# Patient Record
Sex: Female | Born: 1990 | Race: Black or African American | Hispanic: No | Marital: Single | Smoking: Never smoker
Health system: Southern US, Academic
[De-identification: ages and names within clinical notes are randomized; demographics above are authoritative.]

## PROBLEM LIST (undated history)

## (undated) ENCOUNTER — Inpatient Hospital Stay (HOSPITAL_COMMUNITY): Payer: Self-pay

## (undated) DIAGNOSIS — F32A Depression, unspecified: Secondary | ICD-10-CM

## (undated) DIAGNOSIS — D539 Nutritional anemia, unspecified: Secondary | ICD-10-CM

## (undated) DIAGNOSIS — N760 Acute vaginitis: Secondary | ICD-10-CM

## (undated) DIAGNOSIS — R569 Unspecified convulsions: Secondary | ICD-10-CM

## (undated) DIAGNOSIS — R519 Headache, unspecified: Secondary | ICD-10-CM

## (undated) DIAGNOSIS — Z8619 Personal history of other infectious and parasitic diseases: Secondary | ICD-10-CM

## (undated) HISTORY — DX: Personal history of other infectious and parasitic diseases: Z86.19

## (undated) HISTORY — DX: Unspecified convulsions (CMS HCC): R56.9

## (undated) HISTORY — PX: HX BLOOD TRANSFUSION: 2100001151

## (undated) HISTORY — DX: Depression, unspecified: F32.A

## (undated) HISTORY — DX: Acute vaginitis: N76.0

## (undated) HISTORY — PX: HX GASTRIC SLEEVE: 2100003104

## (undated) HISTORY — DX: Headache, unspecified: R51.9

## (undated) HISTORY — DX: Nutritional anemia, unspecified: D53.9

## (undated) NOTE — Progress Notes (Signed)
 Formatting of this note might be different from the original.  O    Flu vaccine  Electronically signed by Arland Connors, LPN at 89/75/7974 10:29 AM EDT

---
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
## 2012-04-16 LAB — OB RESULTS CONSOLE GC/CHLAMYDIA
Chlamydia: NEGATIVE
Gonorrhea: NEGATIVE

## 2012-04-16 LAB — OB RESULTS CONSOLE ANTIBODY SCREEN: Antibody Screen: NEGATIVE

## 2012-04-16 LAB — OB RESULTS CONSOLE RUBELLA ANTIBODY, IGM: Rubella: IMMUNE

## 2012-04-16 LAB — OB RESULTS CONSOLE RPR: RPR: NONREACTIVE

## 2012-07-25 ENCOUNTER — Encounter (HOSPITAL_COMMUNITY): Payer: Self-pay | Admitting: *Deleted

## 2012-07-25 ENCOUNTER — Inpatient Hospital Stay (HOSPITAL_COMMUNITY)
Admission: AD | Admit: 2012-07-25 | Discharge: 2012-07-25 | Disposition: A | Discharge status: Home / Self Care | Payer: 59 | Source: Ambulatory Visit | Attending: Obstetrics and Gynecology | Admitting: Obstetrics and Gynecology

## 2012-07-25 DIAGNOSIS — N949 Unspecified condition associated with female genital organs and menstrual cycle: Secondary | ICD-10-CM

## 2012-07-25 DIAGNOSIS — N898 Other specified noninflammatory disorders of vagina: Secondary | ICD-10-CM

## 2012-07-25 DIAGNOSIS — O99891 Other specified diseases and conditions complicating pregnancy: Secondary | ICD-10-CM

## 2012-07-25 HISTORY — DX: Unspecified convulsions: R56.9

## 2012-07-25 LAB — WET PREP, GENITAL

## 2012-07-25 LAB — POCT FERN TEST: POCT Fern Test: NEGATIVE

## 2012-07-25 NOTE — MAU Note (Signed)
PT SAY  SHE NOTICED ON  WED  THAT SHE WAS LEAKING FLUID- DARK YELLOW- BROWNISH .  BABY NOT MOVING AS MUCH   DENIES CRAMPING, HSV AND MRSA.   LAST SEX WAS MON.

## 2012-07-25 NOTE — MAU Provider Note (Signed)
  History     CSN: [id number]  Arrival date and time: 07/25/12 2148   First Provider Initiated Contact with Patient 07/25/12 2221      Chief Complaint  Patient presents with  . Vaginal Discharge   HPI  Pt is a G1P0 at 24.0 wks IUP with report of leaking of fluid that started 3 days ago.  No report of vaginal bleeding or abdominal pain.  Discharge is described as yellow in nature and requiring use of pantyliners.    Past Medical History  Diagnosis Date  . Seizures     last seizure 2012    Past Surgical History  Procedure Laterality Date  . No past surgeries      Family History  Problem Relation Age of Onset  . Asthma Neg Hx   . Birth defects Neg Hx   . Cancer Neg Hx   . Depression Neg Hx   . Diabetes Neg Hx   . Hearing loss Neg Hx   . Heart disease Neg Hx   . Hyperlipidemia Neg Hx   . Hypertension Neg Hx   . Kidney disease Neg Hx   . Miscarriages / Stillbirths Neg Hx   . Stroke Neg Hx   . Arthritis Neg Hx     History  Substance Use Topics  . Smoking status: Never Smoker   . Smokeless tobacco: Not on file  . Alcohol Use: No    Allergies: No Known Allergies  Prescriptions prior to admission  Medication Sig Dispense Refill  . ibuprofen (ADVIL,MOTRIN) 200 MG tablet Take 200 mg by mouth once.        Review of Systems  Genitourinary:       Vaginal discharge  All other systems reviewed and are negative.   Physical Exam   Blood pressure 117/73, pulse 115, temperature 98.4 F (36.9 C), temperature source Oral, resp. rate 20, height 5\' 5"  (1.651 m), weight 105.858 kg (233 lb 6 oz).  Physical Exam  Constitutional: She is oriented to person, place, and time. She appears well-developed and well-nourished. No distress.  HENT:  Head: Normocephalic.  Neck: Normal range of motion. Neck supple.  Cardiovascular: Normal rate, regular rhythm and normal heart sounds.   Respiratory: Effort normal and breath sounds normal.  GI: Soft. There is no tenderness.   Genitourinary: No bleeding around the vagina. Vaginal discharge (mucusy; thick white discharge seen; no yellow or clear fluid noted) found.  Musculoskeletal: Normal range of motion. She exhibits no edema.  Neurological: She is alert and oriented to person, place, and time.  Skin: Skin is warm and dry.    MAU Course  Procedures  Consulted with Dr. Renaldo Fiddler > reviewed HPI/pt exam/neg fern > ok to discharge home with reassurance  Results for orders placed during the hospital encounter of 07/25/12 (from the past 24 hour(s))  WET PREP, GENITAL     Status: Abnormal   Collection Time    07/25/12 10:30 PM      Result Value Range   Yeast Wet Prep HPF POC NONE SEEN  NONE SEEN   Trich, Wet Prep NONE SEEN  NONE SEEN   Clue Cells Wet Prep HPF POC NONE SEEN  NONE SEEN   WBC, Wet Prep HPF POC FEW (*) NONE SEEN   GC/CT Pending  FHR 140's  Assessment and Plan  Normal Vaginal Discharge  Plan: DC to home Provide reassurance Follow-up in office  [facility] 07/25/2012, 10:36 PM

## 2012-07-25 NOTE — MAU Note (Addendum)
Pt reports leaking dark yellow discharge from vagina since Wednesday night. Pt denies cramping.LAst intercourse Monday. Pt also reports that the baby has been less active since Friday. Pt was checked in office on Monday for BV and yeast, pt states that the test was negative.

## 2012-07-27 LAB — GC/CHLAMYDIA PROBE AMP: GC Probe RNA: NEGATIVE

## 2012-09-25 ENCOUNTER — Institutional Professional Consult (permissible substitution): Payer: Self-pay | Admitting: Neurology

## 2012-11-09 ENCOUNTER — Encounter (HOSPITAL_COMMUNITY): Payer: Self-pay | Admitting: *Deleted

## 2012-11-09 ENCOUNTER — Telehealth (HOSPITAL_COMMUNITY): Payer: Self-pay | Admitting: *Deleted

## 2012-11-09 NOTE — Telephone Encounter (Signed)
Preadmission screen  

## 2012-11-12 ENCOUNTER — Telehealth (HOSPITAL_COMMUNITY): Payer: Self-pay | Admitting: *Deleted

## 2012-11-12 NOTE — Telephone Encounter (Signed)
Preadmission screen Dr Langston Masker spoke with neurologists and received OK for pt to be able to push in labor with tumor and hx of seizures

## 2012-11-17 ENCOUNTER — Encounter (HOSPITAL_COMMUNITY): Payer: Self-pay

## 2012-11-17 ENCOUNTER — Inpatient Hospital Stay (HOSPITAL_COMMUNITY)
Admission: RE | Admit: 2012-11-17 | Discharge: 2012-11-22 | DRG: 765 | Disposition: A | Discharge status: Home / Self Care | Payer: 59 | Source: Ambulatory Visit | Attending: Obstetrics and Gynecology | Admitting: Obstetrics and Gynecology

## 2012-11-17 VITALS — BP 113/75 | HR 109 | Temp 98.4°F | Resp 20 | Ht 65.0 in | Wt 250.0 lb

## 2012-11-17 DIAGNOSIS — O480 Post-term pregnancy: Principal | ICD-10-CM | POA: Diagnosis present

## 2012-11-17 DIAGNOSIS — O9903 Anemia complicating the puerperium: Secondary | ICD-10-CM | POA: Diagnosis not present

## 2012-11-17 DIAGNOSIS — Z98891 History of uterine scar from previous surgery: Secondary | ICD-10-CM

## 2012-11-17 DIAGNOSIS — D6489 Other specified anemias: Secondary | ICD-10-CM | POA: Diagnosis not present

## 2012-11-17 LAB — CBC
MCHC: 32.4 g/dL (ref 30.0–36.0)
Platelets: 297 10*3/uL (ref 150–400)
RDW: 16.1 % — ABNORMAL HIGH (ref 11.5–15.5)
WBC: 12.4 10*3/uL — ABNORMAL HIGH (ref 4.0–10.5)

## 2012-11-17 MED ORDER — FLEET ENEMA 7-19 GM/118ML RE ENEM: 1.0000 | ENEMA | RECTAL | Status: DC | PRN

## 2012-11-17 MED ORDER — LACTATED RINGERS IV SOLN: 500.0000 mL | INTRAVENOUS | Status: DC | PRN

## 2012-11-17 MED ORDER — EPHEDRINE 5 MG/ML INJ
10.0000 mg | INTRAVENOUS | Status: DC | PRN
  Filled 2012-11-17: qty 4

## 2012-11-17 MED ORDER — LACTATED RINGERS IV SOLN
500.0000 mL | Freq: Once | INTRAVENOUS | Status: AC
  Administered 2012-11-19: 500 mL via INTRAVENOUS

## 2012-11-17 MED ORDER — ACETAMINOPHEN 325 MG PO TABS: 650.0000 mg | ORAL_TABLET | ORAL | Status: DC | PRN

## 2012-11-17 MED ORDER — DIPHENHYDRAMINE HCL 50 MG/ML IJ SOLN: 12.5000 mg | INTRAMUSCULAR | Status: DC | PRN

## 2012-11-17 MED ORDER — BUTORPHANOL TARTRATE 1 MG/ML IJ SOLN
1.0000 mg | INTRAMUSCULAR | Status: DC | PRN
  Administered 2012-11-18 – 2012-11-19 (×5): 1 mg via INTRAVENOUS
  Filled 2012-11-17 (×7): qty 1

## 2012-11-17 MED ORDER — OXYTOCIN 40 UNITS IN LACTATED RINGERS INFUSION - SIMPLE MED: 62.5000 mL/h | INTRAVENOUS | Status: DC

## 2012-11-17 MED ORDER — EPHEDRINE 5 MG/ML INJ: 10.0000 mg | INTRAVENOUS | Status: DC | PRN

## 2012-11-17 MED ORDER — CITRIC ACID-SODIUM CITRATE 334-500 MG/5ML PO SOLN
30.0000 mL | ORAL | Status: DC | PRN
  Administered 2012-11-19: 30 mL via ORAL
  Filled 2012-11-17: qty 15

## 2012-11-17 MED ORDER — MISOPROSTOL 25 MCG QUARTER TABLET
25.0000 ug | ORAL_TABLET | ORAL | Status: DC | PRN
  Administered 2012-11-17 – 2012-11-18 (×3): 25 ug via VAGINAL
  Filled 2012-11-17 (×3): qty 0.25

## 2012-11-17 MED ORDER — LIDOCAINE HCL (PF) 1 % IJ SOLN: 30.0000 mL | INTRAMUSCULAR | Status: DC | PRN

## 2012-11-17 MED ORDER — LACTATED RINGERS IV SOLN
INTRAVENOUS | Status: DC
  Administered 2012-11-17 – 2012-11-19 (×4): via INTRAVENOUS

## 2012-11-17 MED ORDER — ZOLPIDEM TARTRATE 5 MG PO TABS
5.0000 mg | ORAL_TABLET | Freq: Every evening | ORAL | Status: DC | PRN
  Administered 2012-11-17 – 2012-11-19 (×2): 5 mg via ORAL
  Filled 2012-11-17 (×2): qty 1

## 2012-11-17 MED ORDER — PHENYLEPHRINE 40 MCG/ML (10ML) SYRINGE FOR IV PUSH (FOR BLOOD PRESSURE SUPPORT): 80.0000 ug | PREFILLED_SYRINGE | INTRAVENOUS | Status: DC | PRN

## 2012-11-17 MED ORDER — IBUPROFEN 600 MG PO TABS: 600.0000 mg | ORAL_TABLET | Freq: Four times a day (QID) | ORAL | Status: DC | PRN

## 2012-11-17 MED ORDER — PHENYLEPHRINE 40 MCG/ML (10ML) SYRINGE FOR IV PUSH (FOR BLOOD PRESSURE SUPPORT)
80.0000 ug | PREFILLED_SYRINGE | INTRAVENOUS | Status: DC | PRN
  Filled 2012-11-17: qty 5

## 2012-11-17 MED ORDER — ONDANSETRON HCL 4 MG/2ML IJ SOLN
4.0000 mg | Freq: Four times a day (QID) | INTRAMUSCULAR | Status: DC | PRN
  Administered 2012-11-19: 4 mg via INTRAVENOUS
  Filled 2012-11-17: qty 2

## 2012-11-17 MED ORDER — FENTANYL 2.5 MCG/ML BUPIVACAINE 1/10 % EPIDURAL INFUSION (WH - ANES)
14.0000 mL/h | INTRAMUSCULAR | Status: DC | PRN
  Administered 2012-11-19: 14 mL/h via EPIDURAL
  Filled 2012-11-17: qty 125

## 2012-11-17 MED ORDER — OXYTOCIN BOLUS FROM INFUSION: 500.0000 mL | INTRAVENOUS | Status: DC

## 2012-11-17 MED ORDER — OXYCODONE-ACETAMINOPHEN 5-325 MG PO TABS: 1.0000 | ORAL_TABLET | ORAL | Status: DC | PRN

## 2012-11-17 MED ORDER — PROMETHAZINE HCL 25 MG/ML IJ SOLN: 12.5000 mg | INTRAMUSCULAR | Status: DC | PRN

## 2012-11-17 MED ORDER — TERBUTALINE SULFATE 1 MG/ML IJ SOLN: 0.2500 mg | Freq: Once | INTRAMUSCULAR | Status: AC | PRN

## 2012-11-18 LAB — TYPE AND SCREEN

## 2012-11-18 LAB — RPR: RPR Ser Ql: NONREACTIVE

## 2012-11-18 LAB — ABO/RH: ABO/RH(D): O POS

## 2012-11-18 MED ORDER — OXYTOCIN 40 UNITS IN LACTATED RINGERS INFUSION - SIMPLE MED
1.0000 m[IU]/min | INTRAVENOUS | Status: DC
  Administered 2012-11-18: 2 m[IU]/min via INTRAVENOUS
  Administered 2012-11-18: 8 m[IU]/min via INTRAVENOUS
  Filled 2012-11-18: qty 1000

## 2012-11-18 MED ORDER — TERBUTALINE SULFATE 1 MG/ML IJ SOLN: 0.2500 mg | Freq: Once | INTRAMUSCULAR | Status: AC | PRN

## 2012-11-18 NOTE — H&P (Signed)
Nicole Woods is a 22 y.o. female presenting for post-dates induction.  She received VMP x 2 overnight and reports minimal cramping this AM.  +FM.  No VB, LOF.  GBS negative.  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  Negative first tri screen.  Pt is Jehovah's Witness and refuses all blood products.    Maternal Medical History:  Contractions: Onset was 1 week ago.   Frequency: rare.   Perceived severity is mild.    Fetal activity: Perceived fetal activity is normal.   Last perceived fetal movement was within the past hour.    Prenatal complications: no prenatal complications Prenatal Complications - Diabetes: none.    OB History   Grav Para Term Preterm Abortions TAB SAB Ect Mult Living   1              Past Medical History  Diagnosis Date  . Hx of varicella   . 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 No past surgeries     Family History: family history is negative for Asthma, and Birth defects, and Cancer, and Depression, and Diabetes, and Hearing loss, and Heart disease, and Hyperlipidemia, and Hypertension, and Kidney disease, and Miscarriages / Stillbirths, and Stroke, and Arthritis, and Alcohol abuse, and COPD, and Drug abuse, and Early death, and Learning disabilities, and Mental illness, and Mental retardation, and Vision loss, . Social History:  reports that she has never smoked. She has never used smokeless tobacco. She reports that she does not drink alcohol or use illicit drugs.   Prenatal Transfer Tool  Maternal Diabetes: No Genetic Screening: Normal Maternal Ultrasounds/Referrals: Normal Fetal Ultrasounds or other Referrals:  None Maternal Substance Abuse:  No Significant Maternal Medications:  None Significant Maternal Lab Results:  None Other Comments:  None  ROS  Dilation: 1 Effacement (%): Thick Station:  -3 Exam by:: Dr Langston Masker Blood pressure 137/64, pulse 74, temperature 98.3 F (36.8 C), temperature source Oral, resp. rate 20, height 5\' 5"  (1.651 m), weight 113.399 kg (250 lb), SpO2 98.00%. Maternal Exam:  Uterine Assessment: Contraction strength is mild.  Contraction frequency is rare.   Abdomen: Patient reports no abdominal tenderness. Fundal height is c/w dates.   Estimated fetal weight is 8#.   Fetal presentation: vertex  Introitus: Normal vulva. Normal vagina.  Pelvis: adequate for delivery.   Cervix: Cervix evaluated by digital exam.     Physical Exam  Constitutional: She appears well-developed and well-nourished.  GI: Soft. There is no rebound and no guarding.  Neurological: She is alert.  Skin: Skin is warm and dry.  Psychiatric: She has a normal mood and affect. Her behavior is normal.    Prenatal labs: ABO, Rh: O/Positive/-- (11/14 0000) Antibody: Negative (11/14 0000) Rubella: Immune (11/14 0000) RPR: NON REACTIVE (06/17 2058)  HBsAg: Negative (11/14 0000)  HIV: Non-reactive (11/14 0000)  GBS: Negative (06/09 0000)   Assessment/Plan: 22yo G1 at [redacted]w[redacted]d for PDI -Continue VMP for cervical ripening.  -Epidural if desired.   Nandika Stetzer 11/18/2012, 8:20 AM

## 2012-11-18 NOTE — Progress Notes (Signed)
Comfortable s/p Stadol  VSS.   FHT Cat I Toco irregular SVE Cook catheter out  22yo G1 at [redacted]w[redacted]d for [facility] -Continue pitocin -Epidural if desired

## 2012-11-18 NOTE — Progress Notes (Signed)
Feeling mild cramps.    No change in SVE (still 1/th/hi) after 3 VMP with irregular CTX monitored.  FHT Cat I  Counseled for [facility] catheter placement.   Cook placed without difficulty.  60cc water instilled.   Add pitocin if CTX space.

## 2012-11-19 ENCOUNTER — Encounter (HOSPITAL_COMMUNITY)
Admission: RE | Disposition: A | Discharge status: Home / Self Care | Payer: Self-pay | Source: Ambulatory Visit | Attending: Obstetrics and Gynecology

## 2012-11-19 ENCOUNTER — Inpatient Hospital Stay (HOSPITAL_COMMUNITY): Payer: 59 | Admitting: Anesthesiology

## 2012-11-19 ENCOUNTER — Encounter (HOSPITAL_COMMUNITY): Payer: Self-pay | Admitting: Anesthesiology

## 2012-11-19 ENCOUNTER — Encounter (HOSPITAL_COMMUNITY): Payer: Self-pay

## 2012-11-19 SURGERY — Surgical Case
Anesthesia: Epidural | Site: Abdomen | Wound class: Clean Contaminated

## 2012-11-19 MED ORDER — MEPERIDINE HCL 25 MG/ML IJ SOLN
INTRAMUSCULAR | Status: AC
  Filled 2012-11-19: qty 1

## 2012-11-19 MED ORDER — NALOXONE HCL 0.4 MG/ML IJ SOLN: 0.4000 mg | INTRAMUSCULAR | Status: DC | PRN

## 2012-11-19 MED ORDER — LIDOCAINE HCL (PF) 1 % IJ SOLN
INTRAMUSCULAR | Status: DC | PRN
  Administered 2012-11-19 (×4): 4 mL

## 2012-11-19 MED ORDER — MORPHINE SULFATE (PF) 0.5 MG/ML IJ SOLN
INTRAMUSCULAR | Status: DC | PRN
  Administered 2012-11-19: 2 mg via INTRAVENOUS
  Administered 2012-11-19: 3 mg via EPIDURAL

## 2012-11-19 MED ORDER — HYDROMORPHONE HCL PF 1 MG/ML IJ SOLN
0.5000 mg | Freq: Once | INTRAMUSCULAR | Status: AC
  Administered 2012-11-19: 14:00:00 via INTRAVENOUS
  Filled 2012-11-19: qty 1

## 2012-11-19 MED ORDER — SENNOSIDES-DOCUSATE SODIUM 8.6-50 MG PO TABS
2.0000 | ORAL_TABLET | Freq: Every day | ORAL | Status: DC
  Administered 2012-11-20 – 2012-11-21 (×2): 2 via ORAL

## 2012-11-19 MED ORDER — FLEET ENEMA 7-19 GM/118ML RE ENEM: 1.0000 | ENEMA | Freq: Every day | RECTAL | Status: DC | PRN

## 2012-11-19 MED ORDER — NALOXONE HCL 1 MG/ML IJ SOLN
1.0000 ug/kg/h | INTRAMUSCULAR | Status: DC | PRN
  Filled 2012-11-19: qty 2

## 2012-11-19 MED ORDER — KETAMINE HCL 10 MG/ML IJ SOLN
INTRAMUSCULAR | Status: DC | PRN
  Administered 2012-11-19: 40 mg via INTRAVENOUS
  Administered 2012-11-19: 30 mg via INTRAVENOUS

## 2012-11-19 MED ORDER — DIBUCAINE 1 % RE OINT: 1.0000 "application " | TOPICAL_OINTMENT | RECTAL | Status: DC | PRN

## 2012-11-19 MED ORDER — ACETAMINOPHEN 10 MG/ML IV SOLN
1000.0000 mg | Freq: Four times a day (QID) | INTRAVENOUS | Status: DC
  Filled 2012-11-19 (×3): qty 100

## 2012-11-19 MED ORDER — TETANUS-DIPHTH-ACELL PERTUSSIS 5-2.5-18.5 LF-MCG/0.5 IM SUSP: 0.5000 mL | Freq: Once | INTRAMUSCULAR | Status: DC

## 2012-11-19 MED ORDER — IBUPROFEN 800 MG PO TABS
800.0000 mg | ORAL_TABLET | Freq: Three times a day (TID) | ORAL | Status: DC | PRN
  Administered 2012-11-19 – 2012-11-22 (×7): 800 mg via ORAL
  Filled 2012-11-19 (×7): qty 1

## 2012-11-19 MED ORDER — SCOPOLAMINE 1 MG/3DAYS TD PT72
1.0000 | MEDICATED_PATCH | Freq: Once | TRANSDERMAL | Status: DC
  Filled 2012-11-19: qty 1

## 2012-11-19 MED ORDER — MEPERIDINE HCL 25 MG/ML IJ SOLN
6.2500 mg | INTRAMUSCULAR | Status: DC | PRN
Start: 2012-11-19 — End: 2012-11-19

## 2012-11-19 MED ORDER — ONDANSETRON HCL 4 MG/2ML IJ SOLN
INTRAMUSCULAR | Status: AC
  Filled 2012-11-19: qty 2

## 2012-11-19 MED ORDER — DIPHENHYDRAMINE HCL 50 MG/ML IJ SOLN: 12.5000 mg | INTRAMUSCULAR | Status: DC | PRN

## 2012-11-19 MED ORDER — CEFAZOLIN SODIUM 1-5 GM-% IV SOLN
1.0000 g | Freq: Four times a day (QID) | INTRAVENOUS | Status: AC
  Administered 2012-11-19 – 2012-11-20 (×3): 1 g via INTRAVENOUS
  Filled 2012-11-19 (×3): qty 50

## 2012-11-19 MED ORDER — PHENYLEPHRINE 40 MCG/ML (10ML) SYRINGE FOR IV PUSH (FOR BLOOD PRESSURE SUPPORT)
PREFILLED_SYRINGE | INTRAVENOUS | Status: AC
  Filled 2012-11-19: qty 10

## 2012-11-19 MED ORDER — DIPHENHYDRAMINE HCL 50 MG/ML IJ SOLN: 25.0000 mg | INTRAMUSCULAR | Status: DC | PRN

## 2012-11-19 MED ORDER — MENTHOL 3 MG MT LOZG: 1.0000 | LOZENGE | OROMUCOSAL | Status: DC | PRN

## 2012-11-19 MED ORDER — LANOLIN HYDROUS EX OINT: 1.0000 "application " | TOPICAL_OINTMENT | CUTANEOUS | Status: DC | PRN

## 2012-11-19 MED ORDER — ONDANSETRON HCL 4 MG/2ML IJ SOLN: 4.0000 mg | Freq: Three times a day (TID) | INTRAMUSCULAR | Status: DC | PRN

## 2012-11-19 MED ORDER — ZOLPIDEM TARTRATE 5 MG PO TABS: 5.0000 mg | ORAL_TABLET | Freq: Every evening | ORAL | Status: DC | PRN

## 2012-11-19 MED ORDER — SODIUM BICARBONATE 8.4 % IV SOLN
INTRAVENOUS | Status: DC | PRN
  Administered 2012-11-19: 10 mL via EPIDURAL

## 2012-11-19 MED ORDER — MIDAZOLAM HCL 5 MG/5ML IJ SOLN
INTRAMUSCULAR | Status: DC | PRN
  Administered 2012-11-19: 2 mg via INTRAVENOUS

## 2012-11-19 MED ORDER — DIPHENHYDRAMINE HCL 25 MG PO CAPS
25.0000 mg | ORAL_CAPSULE | ORAL | Status: DC | PRN
Start: 2012-11-19 — End: 2012-11-22

## 2012-11-19 MED ORDER — LACTATED RINGERS IV SOLN
INTRAVENOUS | Status: DC | PRN
  Administered 2012-11-19: 12:00:00 via INTRAVENOUS

## 2012-11-19 MED ORDER — LIDOCAINE-EPINEPHRINE (PF) 2 %-1:200000 IJ SOLN
INTRAMUSCULAR | Status: AC
  Filled 2012-11-19: qty 20

## 2012-11-19 MED ORDER — NALBUPHINE HCL 10 MG/ML IJ SOLN
5.0000 mg | INTRAMUSCULAR | Status: DC | PRN
  Filled 2012-11-19: qty 1

## 2012-11-19 MED ORDER — SODIUM CHLORIDE 0.9 % IJ SOLN: 3.0000 mL | Freq: Two times a day (BID) | INTRAMUSCULAR | Status: DC

## 2012-11-19 MED ORDER — MEASLES, MUMPS & RUBELLA VAC ~~LOC~~ INJ: 0.5000 mL | INJECTION | Freq: Once | SUBCUTANEOUS | Status: DC

## 2012-11-19 MED ORDER — SIMETHICONE 80 MG PO CHEW
80.0000 mg | CHEWABLE_TABLET | Freq: Three times a day (TID) | ORAL | Status: DC
  Administered 2012-11-20 – 2012-11-22 (×9): 80 mg via ORAL

## 2012-11-19 MED ORDER — HYDROMORPHONE HCL PF 1 MG/ML IJ SOLN: 0.2500 mg | INTRAMUSCULAR | Status: DC | PRN

## 2012-11-19 MED ORDER — MIDAZOLAM HCL 2 MG/2ML IJ SOLN
INTRAMUSCULAR | Status: AC
  Filled 2012-11-19: qty 2

## 2012-11-19 MED ORDER — NALBUPHINE HCL 10 MG/ML IJ SOLN
5.0000 mg | INTRAMUSCULAR | Status: DC | PRN
  Filled 2012-11-19 (×2): qty 1

## 2012-11-19 MED ORDER — SODIUM CHLORIDE 0.9 % IV SOLN: 250.0000 mL | INTRAVENOUS | Status: DC

## 2012-11-19 MED ORDER — HYDROMORPHONE HCL PF 1 MG/ML IJ SOLN
INTRAMUSCULAR | Status: AC
  Administered 2012-11-19: 0.5 mg via INTRAVENOUS
  Filled 2012-11-19: qty 1

## 2012-11-19 MED ORDER — MEPERIDINE HCL 25 MG/ML IJ SOLN
INTRAMUSCULAR | Status: DC | PRN
  Administered 2012-11-19: 25 mg via INTRAVENOUS

## 2012-11-19 MED ORDER — BISACODYL 10 MG RE SUPP: 10.0000 mg | Freq: Every day | RECTAL | Status: DC | PRN

## 2012-11-19 MED ORDER — ONDANSETRON HCL 4 MG/2ML IJ SOLN
INTRAMUSCULAR | Status: DC | PRN
  Administered 2012-11-19: 4 mg via INTRAVENOUS

## 2012-11-19 MED ORDER — MORPHINE SULFATE 0.5 MG/ML IJ SOLN
INTRAMUSCULAR | Status: AC
  Filled 2012-11-19: qty 10

## 2012-11-19 MED ORDER — SODIUM CHLORIDE 0.9 % IJ SOLN: 3.0000 mL | INTRAMUSCULAR | Status: DC | PRN

## 2012-11-19 MED ORDER — OXYTOCIN 10 UNIT/ML IJ SOLN
40.0000 [IU] | INTRAVENOUS | Status: DC | PRN
  Administered 2012-11-19: 40 [IU] via INTRAVENOUS

## 2012-11-19 MED ORDER — ONDANSETRON HCL 4 MG/2ML IJ SOLN
4.0000 mg | INTRAMUSCULAR | Status: DC | PRN
  Administered 2012-11-19: 4 mg via INTRAVENOUS
  Filled 2012-11-19: qty 2

## 2012-11-19 MED ORDER — HYDROMORPHONE HCL PF 1 MG/ML IJ SOLN: INTRAMUSCULAR | Status: DC | PRN

## 2012-11-19 MED ORDER — ACETAMINOPHEN 10 MG/ML IV SOLN
1000.0000 mg | Freq: Once | INTRAVENOUS | Status: AC
  Administered 2012-11-19: 1000 mg via INTRAVENOUS
  Filled 2012-11-19: qty 100

## 2012-11-19 MED ORDER — 0.9 % SODIUM CHLORIDE (POUR BTL) OPTIME
TOPICAL | Status: DC | PRN
  Administered 2012-11-19: 1000 mL

## 2012-11-19 MED ORDER — SODIUM BICARBONATE 8.4 % IV SOLN
INTRAVENOUS | Status: AC
  Filled 2012-11-19: qty 50

## 2012-11-19 MED ORDER — CEFAZOLIN SODIUM-DEXTROSE 2-3 GM-% IV SOLR
2.0000 g | Freq: Once | INTRAVENOUS | Status: AC
  Administered 2012-11-19: 2 g via INTRAVENOUS
  Filled 2012-11-19: qty 50

## 2012-11-19 MED ORDER — ONDANSETRON HCL 4 MG PO TABS: 4.0000 mg | ORAL_TABLET | ORAL | Status: DC | PRN

## 2012-11-19 MED ORDER — LACTATED RINGERS IV SOLN
INTRAVENOUS | Status: DC | PRN
  Administered 2012-11-19 (×3): via INTRAVENOUS

## 2012-11-19 MED ORDER — OXYTOCIN 40 UNITS IN LACTATED RINGERS INFUSION - SIMPLE MED: 62.5000 mL/h | INTRAVENOUS | Status: AC

## 2012-11-19 MED ORDER — OXYCODONE-ACETAMINOPHEN 5-325 MG PO TABS
1.0000 | ORAL_TABLET | Freq: Four times a day (QID) | ORAL | Status: DC | PRN
Start: 2012-11-19 — End: 2012-11-22
  Administered 2012-11-20 (×2): 2 via ORAL
  Administered 2012-11-20 – 2012-11-21 (×2): 1 via ORAL
  Administered 2012-11-21: 2 via ORAL
  Administered 2012-11-21 – 2012-11-22 (×4): 1 via ORAL
  Filled 2012-11-19: qty 2
  Filled 2012-11-19 (×2): qty 1
  Filled 2012-11-19: qty 2
  Filled 2012-11-19 (×3): qty 1
  Filled 2012-11-19 (×2): qty 2
  Filled 2012-11-19: qty 1

## 2012-11-19 MED ORDER — PHENYLEPHRINE HCL 10 MG/ML IJ SOLN
INTRAMUSCULAR | Status: DC | PRN
  Administered 2012-11-19: 80 ug via INTRAVENOUS
  Administered 2012-11-19: 120 ug via INTRAVENOUS
  Administered 2012-11-19: 80 ug via INTRAVENOUS

## 2012-11-19 MED ORDER — PHENYLEPHRINE 40 MCG/ML (10ML) SYRINGE FOR IV PUSH (FOR BLOOD PRESSURE SUPPORT)
PREFILLED_SYRINGE | INTRAVENOUS | Status: AC
  Filled 2012-11-19: qty 15

## 2012-11-19 MED ORDER — KETAMINE HCL 10 MG/ML IJ SOLN
INTRAMUSCULAR | Status: AC
  Filled 2012-11-19: qty 1

## 2012-11-19 MED ORDER — OXYTOCIN 10 UNIT/ML IJ SOLN
INTRAMUSCULAR | Status: AC
  Filled 2012-11-19: qty 4

## 2012-11-19 MED ORDER — METOCLOPRAMIDE HCL 5 MG/ML IJ SOLN
10.0000 mg | Freq: Three times a day (TID) | INTRAMUSCULAR | Status: DC | PRN
  Administered 2012-11-19: 10 mg via INTRAVENOUS
  Filled 2012-11-19: qty 2

## 2012-11-19 MED ORDER — SIMETHICONE 80 MG PO CHEW: 80.0000 mg | CHEWABLE_TABLET | ORAL | Status: DC | PRN

## 2012-11-19 MED ORDER — DIPHENHYDRAMINE HCL 25 MG PO CAPS: 25.0000 mg | ORAL_CAPSULE | Freq: Four times a day (QID) | ORAL | Status: DC | PRN

## 2012-11-19 MED ORDER — WITCH HAZEL-GLYCERIN EX PADS: 1.0000 "application " | MEDICATED_PAD | CUTANEOUS | Status: DC | PRN

## 2012-11-19 MED ORDER — PRENATAL MULTIVITAMIN CH
1.0000 | ORAL_TABLET | Freq: Every day | ORAL | Status: DC
  Administered 2012-11-20 – 2012-11-21 (×2): 1 via ORAL
  Filled 2012-11-19 (×2): qty 1

## 2012-11-19 SURGICAL SUPPLY — 23 items
CLAMP CORD UMBIL (MISCELLANEOUS) ×2 IMPLANT
CLOTH BEACON ORANGE TIMEOUT ST (SAFETY) ×2 IMPLANT
DRAPE LG THREE QUARTER DISP (DRAPES) ×2 IMPLANT
DRSG OPSITE POSTOP 4X10 (GAUZE/BANDAGES/DRESSINGS) ×2 IMPLANT
DURAPREP 26ML APPLICATOR (WOUND CARE) ×2 IMPLANT
ELECT REM PT RETURN 9FT ADLT (ELECTROSURGICAL) ×2
ELECTRODE REM PT RTRN 9FT ADLT (ELECTROSURGICAL) ×1 IMPLANT
GLOVE BIO SURGEON STRL SZ7 (GLOVE) ×2 IMPLANT
GOWN STRL REIN XL XLG (GOWN DISPOSABLE) ×4 IMPLANT
KIT ABG SYR 3ML LUER SLIP (SYRINGE) ×2 IMPLANT
NEEDLE HYPO 25X5/8 SAFETYGLIDE (NEEDLE) ×2 IMPLANT
NS IRRIG 1000ML POUR BTL (IV SOLUTION) ×2 IMPLANT
PACK C SECTION WH (CUSTOM PROCEDURE TRAY) ×2 IMPLANT
PAD OB MATERNITY 4.3X12.25 (PERSONAL CARE ITEMS) ×2 IMPLANT
STRIP CLOSURE SKIN 1/2X4 (GAUZE/BANDAGES/DRESSINGS) ×2 IMPLANT
SUT CHROMIC 0 CTX 36 (SUTURE) ×8 IMPLANT
SUT MON AB 4-0 PS1 27 (SUTURE) ×2 IMPLANT
SUT PDS AB 0 CT1 27 (SUTURE) ×4 IMPLANT
SUT VIC AB 3-0 CT1 27 (SUTURE) ×4
SUT VIC AB 3-0 CT1 TAPERPNT 27 (SUTURE) ×2 IMPLANT
TOWEL OR 17X24 6PK STRL BLUE (TOWEL DISPOSABLE) ×6 IMPLANT
TRAY FOLEY CATH 14FR (SET/KITS/TRAYS/PACK) ×2 IMPLANT
WATER STERILE IRR 1000ML POUR (IV SOLUTION) ×2 IMPLANT

## 2012-11-19 NOTE — Op Note (Signed)
Preoperative diagnosis: Failure to progress  Postoperative diagnosis: Same  Procedure: Primary low transverse cesarean section  Surgeon: Marcelle Overlie  Anesthesia: Epidural  Drains: Foley catheter  EBL: 700 cc  Procedure and findings:  Patient taken the operating room after appropriate timeout for taken with the patient in left tilt position she was prepped and draped in usual fashion, Foley catheter was arty positioned draining clear urine. Transverse incision made 2 finger breaths above the symphysis carried down to the fascia which was incised and extended transversely. Rectus muscle divided in the midline peritoneum entered superiorly without incident and extended in a vertical fashion. The vesicouterine serosa was incised and the bladder was bluntly and sharply dissected below, bladder blade repositioned. Transverse incision made lower segment extended with blunt dissection the patient delivered of a healthy female loose cord around the chest x1 the infant was suctioned cord clamped and passed the pediatric team for further care. Cord blood was obtained along with pH placenta was then delivered manually intact, sent to pathology. Uterus exteriorized cavity wiped clean with laparotomy pack closure transversely of 0 chromic in a locked fashion followed by Dimetane layer of 0 chromic. This is hemostatic bilateral tubes and ovaries were normal. The bladder flap area was intact and hemostatic. Prior to closure sponge denies precast reported as correct x2 peritoneum was enclose a running 3-0 Vicryl suture. Fascia closure laterally to midline on either side with 0 PDS suture. Subcutaneous tissue was irrigated noted to be mild to moderate in thickness was not closed separately. 4-0 Monocryl subcuticular skin closure with sterile dressing applied she did receive Ancef 2 g IV preop this will be continued for several doses postop because of a prepartum fever. Mother and baby doing well that point.  Dictated  with [facility]  Aslan Himes M. Marcelle Overlie  MD

## 2012-11-19 NOTE — Anesthesia Preprocedure Evaluation (Addendum)
Anesthesia Evaluation  Patient identified by MRN, date of birth, ID band Patient awake    Reviewed: Allergy & Precautions, H&P , NPO status , Patient's Chart, lab work & pertinent test results, reviewed documented beta blocker date and time   History of Anesthesia Complications Negative for: history of anesthetic complications  Airway Mallampati: III TM Distance: >3 FB Neck ROM: full    Dental  (+) Teeth Intact   Pulmonary neg pulmonary ROS,  breath sounds clear to auscultation        Cardiovascular negative cardio ROS  Rhythm:regular Rate:Normal     Neuro/Psych Seizures - (had one seizure at age 76, then another at age 54 - took meds for one month after second seizure and then stopped..  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 (the MRI read says "Questionable asymmetric T2 hyperintensity in the left parahippocampal gyrus"  - no evidence of space occupying lesion)   She never had any other scans.  Saw neuro at [facility] one time after that seizure, then no follow up until after second seizure when she briefly saw [facility].  Saw neuro once during pregnancy for EEG. Told OK to push (per Dr Langston Masker). negative psych ROS   GI/Hepatic negative GI ROS, Neg liver ROS,   Endo/Other  Morbid obesity  Renal/GU negative Renal ROS     Musculoskeletal   Abdominal   Peds  Hematology  (+) anemia , REFUSES BLOOD PRODUCTS, JEHOVAH'S WITNESShgb 9.2   Anesthesia Other Findings   Reproductive/Obstetrics (+) Pregnancy                        Anesthesia Physical Anesthesia Plan  ASA: III and emergent  Anesthesia Plan: Epidural   Post-op Pain Management:    Induction:   Airway Management Planned: Natural Airway and Simple Face Mask  Additional Equipment:   Intra-op Plan:   Post-operative Plan:   Informed Consent: I have  reviewed the patients History and Physical, chart, labs and discussed the procedure including the risks, benefits and alternatives for the proposed anesthesia with the patient or authorized representative who has indicated his/her understanding and acceptance.   Dental advisory given  Plan Discussed with: CRNA, Anesthesiologist and Surgeon  Anesthesia Plan Comments: (Patient for C/Section for failure to progress.)       Anesthesia Quick Evaluation

## 2012-11-19 NOTE — Anesthesia Postprocedure Evaluation (Signed)
  Anesthesia Post-op Note  Patient: Nicole Woods  Procedure(s) Performed: Procedure(s): CESAREAN SECTION (N/A)  Patient Location: PACU  Anesthesia Type:Epidural  Level of Consciousness: awake, alert  and oriented  Airway and Oxygen Therapy: Patient Spontanous Breathing  Post-op Pain: none  Post-op Assessment: Post-op Vital signs reviewed, Patient's Cardiovascular Status Stable, Respiratory Function Stable, Patent Airway, No signs of Nausea or vomiting, Pain level controlled, No headache and No backache  Post-op Vital Signs: Reviewed and stable  Complications: No apparent anesthesia complications

## 2012-11-19 NOTE — Progress Notes (Signed)
Dr Marcelle Overlie at bedside discussing risks and benefits of C/S. Pt verbalizes understanding and has no further questions.

## 2012-11-19 NOTE — Anesthesia Procedure Notes (Signed)
Epidural Patient location during procedure: OB Start time: 11/19/2012 3:52 AM  Staffing Performed by: anesthesiologist   Preanesthetic Checklist Completed: patient identified, site marked, surgical consent, pre-op evaluation, timeout performed, IV checked, risks and benefits discussed and monitors and equipment checked  Epidural Patient position: sitting Prep: site prepped and draped and DuraPrep Patient monitoring: continuous pulse ox and blood pressure Approach: midline Injection technique: LOR air  Needle:  Needle type: Tuohy  Needle gauge: 17 G Needle length: 9 cm and 9 Needle insertion depth: 6 cm Catheter type: closed end flexible Catheter size: 19 Gauge Catheter at skin depth: 11 cm Test dose: negative  Assessment Events: blood not aspirated, injection not painful, no injection resistance, negative IV test and no paresthesia  Additional Notes Discussed risk of headache, infection, bleeding, nerve injury and failed or incomplete block.  Patient voices understanding and wishes to proceed.  Epidural placed easily on first attempt.  No paresthesia.  Patient tolerated procedure well with no apparent complications.  Jasmine December, MDReason for block:procedure for pain

## 2012-11-19 NOTE — Progress Notes (Signed)
No change from 5 cm, discussed CS for FTP, pt agrees, proced + risks discussed

## 2012-11-19 NOTE — Progress Notes (Signed)
Reviewed EFM>>cat 1  adeq labor pattern by IUPC, now 5/90/vtx/-1  Will recheck 1.5 hr, if no change>>CS  Pt has signed refusal for ALL blood products under any circumstance  CS, proced + risks discussed

## 2012-11-19 NOTE — Anesthesia Postprocedure Evaluation (Signed)
  Anesthesia Post-op Note  Patient: Nicole Woods  Procedure(s) Performed: Procedure(s): CESAREAN SECTION (N/A)  Patient Location: Mother/Baby  Anesthesia Type:Epidural  Level of Consciousness: awake  Airway and Oxygen Therapy: Patient Spontanous Breathing  Post-op Pain: mild  Post-op Assessment: Patient's Cardiovascular Status Stable and Respiratory Function Stable  Post-op Vital Signs: stable  Complications: No apparent anesthesia complications

## 2012-11-19 NOTE — Anesthesia Preprocedure Evaluation (Deleted)
Anesthesia Evaluation  Patient identified by MRN, date of birth, ID band Patient awake    Reviewed: Allergy & Precautions, H&P , NPO status , Patient's Chart, lab work & pertinent test results, reviewed documented beta blocker date and time   History of Anesthesia Complications Negative for: history of anesthetic complications  Airway Mallampati: III TM Distance: >3 FB Neck ROM: full    Dental  (+) Teeth Intact   Pulmonary neg pulmonary ROS,  breath sounds clear to auscultation        Cardiovascular negative cardio ROS  Rhythm:regular Rate:Normal     Neuro/Psych Seizures - (had one seizure at age 89, then another at age 31 - took meds for one month after second seizure and then stopped..  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 (the MRI read says "Questionable asymmetric T2 hyperintensity in the left parahippocampal gyrus"  - no evidence of space occupying lesion)   She never had any other scans.  Saw neuro at [facility] one time after that seizure, then no follow up until after second seizure when she briefly saw [facility].  Saw neuro once during pregnancy for EEG. Told OK to push (per Dr Langston Masker). negative psych ROS   GI/Hepatic negative GI ROS, Neg liver ROS,   Endo/Other  Morbid obesity  Renal/GU negative Renal ROS     Musculoskeletal   Abdominal   Peds  Hematology  (+) REFUSES BLOOD PRODUCTS, JEHOVAH'S WITNESShgb 9.2   Anesthesia Other Findings   Reproductive/Obstetrics (+) Pregnancy                           Anesthesia Physical  Anesthesia Plan  ASA: III and emergent  Anesthesia Plan: Epidural   Post-op Pain Management:    Induction:   Airway Management Planned: Natural Airway  Additional Equipment:   Intra-op Plan:   Post-operative Plan:   Informed Consent: I have reviewed the patients  History and Physical, chart, labs and discussed the procedure including the risks, benefits and alternatives for the proposed anesthesia with the patient or authorized representative who has indicated his/her understanding and acceptance.   Dental advisory given  Plan Discussed with: CRNA, Anesthesiologist and Surgeon  Anesthesia Plan Comments:         Anesthesia Quick Evaluation

## 2012-11-19 NOTE — Consult Note (Signed)
Neonatology Note:  Attendance at C-section:  I was asked by Dr. Holland to attend this primary C/S at 40 5/7 weeks due to FTP. The mother is a G1P0 O pos, GBS neg with maximum temperature during labor of 101.8 degrees just prior to C/S. ROM time is somewhat unsure, but was thought to be 20 hours prior to delivery, fluid with meconium present. Infant vigorous with good spontaneous cry and tone. Needed only minimal bulb suctioning for some light green mucous. Ap 9/9. Lungs clear to ausc in DR. There are 2 very superficial lacerations on her left cheek, which was at the anterior uterus at the time of surgical incision. The larger one is about 8 mm in length, shown to MGM in DR. The mother was asleep following delivery so I was not able to speak with her. To CN to care of Pediatrician.  Kamil Hanigan C. Janece Laidlaw, MD  

## 2012-11-19 NOTE — Transfer of Care (Signed)
Immediate Anesthesia Transfer of Care Note  Patient: Nicole Woods  Procedure(s) Performed: * No procedures listed *  Patient Location: PACU  Anesthesia Type:Epidural  Level of Consciousness: awake, alert  and oriented  Airway & Oxygen Therapy: Patient Spontanous Breathing and Patient connected to nasal cannula oxygen  Post-op Assessment: Report given to PACU RN, Post -op Vital signs reviewed and stable and Patient moving all extremities  Post vital signs: Reviewed and stable  Complications: No apparent anesthesia complications

## 2012-11-19 NOTE — Progress Notes (Signed)
Comfortable s/p Stadol  VSS. FHT Cat I Toco q 2-3, pit 10 SVE 4-5/50/-2, AROM forebag with mod mec, IUPC placed  22yo G1 at [redacted]w[redacted]d with PDI Will monitor MVUs  Reassuring NST Epidural when desired

## 2012-11-20 ENCOUNTER — Encounter (HOSPITAL_COMMUNITY): Payer: Self-pay | Admitting: Obstetrics and Gynecology

## 2012-11-20 LAB — CBC
MCH: 25.8 pg — ABNORMAL LOW (ref 26.0–34.0)
MCHC: 32.5 g/dL (ref 30.0–36.0)
MCV: 79.4 fL (ref 78.0–100.0)
Platelets: 231 10*3/uL (ref 150–400)
RBC: 2.48 MIL/uL — ABNORMAL LOW (ref 3.87–5.11)

## 2012-11-20 MED ORDER — FERROUS SULFATE 325 (65 FE) MG PO TABS
325.0000 mg | ORAL_TABLET | Freq: Three times a day (TID) | ORAL | Status: DC
  Administered 2012-11-20 – 2012-11-22 (×6): 325 mg via ORAL
  Filled 2012-11-20 (×6): qty 1

## 2012-11-20 NOTE — Progress Notes (Signed)
CRITICAL VALUE ALERT  Critical value received :HgB 6.4    Date of notification:  11/20/2012  Time of notification:  0600  Critical value read back :yes   Nurse who received alert:S. Clearance Coots   MD notified (1st page):  With rounds   Time of first page:  With rounds  MD notified (2nd page):  Time of second page:  Responding MD:  On call            Time MD responded:  With rounds

## 2012-11-20 NOTE — Progress Notes (Signed)
Subjective: Postpartum Day 1: Cesarean Delivery Patient reports tolerating PO, + flatus and no problems voiding.    Objective: Vital signs in last 24 hours: Temp:  [98 F (36.7 C)-102 F (38.9 C)] 98.2 F (36.8 C) (06/20 0458) Pulse Rate:  [90-153] 103 (06/20 0458) Resp:  [12-18] 18 (06/20 0458) BP: (92-156)/(52-86) 105/63 mmHg (06/20 0458) SpO2:  [94 %-100 %] 96 % (06/20 0458)  Physical Exam:  General: alert and cooperative Lochia: appropriate Uterine Fundus: firm non-tender Incision: honeycomb dressing CDI DVT Evaluation: No evidence of DVT seen on physical exam. Negative Homan's sign. No cords or calf tenderness. Calf/Ankle edema is present.  patient ambulating without complaint of dizziness or SOB   Recent Labs  11/17/12 2058 11/20/12 0600  HGB 9.2* 6.4*  HCT 28.4* 19.7*    Assessment/Plan: Status post Cesarean section. Postoperative course complicated by fever and leukocytosis and anemia ATb's(Ancef ) complete , consider starting Unasyn. CBC ordered for the am Will start FESO4  Christiana Gurevich G 11/20/2012, 8:09 AM

## 2012-11-21 LAB — CBC
HCT: 19.8 % — ABNORMAL LOW (ref 36.0–46.0)
Hemoglobin: 6.4 g/dL — CL (ref 12.0–15.0)
MCH: 25.7 pg — ABNORMAL LOW (ref 26.0–34.0)
MCHC: 32.3 g/dL (ref 30.0–36.0)
MCV: 79.5 fL (ref 78.0–100.0)
Platelets: 257 K/uL (ref 150–400)
RBC: 2.49 MIL/uL — ABNORMAL LOW (ref 3.87–5.11)
RDW: 16.4 % — ABNORMAL HIGH (ref 11.5–15.5)
WBC: 18.7 K/uL — ABNORMAL HIGH (ref 4.0–10.5)

## 2012-11-21 MED ORDER — FERUMOXYTOL INJECTION 510 MG/17 ML
510.0000 mg | Freq: Once | INTRAVENOUS | Status: AC
  Administered 2012-11-21: 510 mg via INTRAVENOUS
  Filled 2012-11-21: qty 17

## 2012-11-21 NOTE — Progress Notes (Signed)
Pt denies any palpations or symptoms of increased pulse

## 2012-11-21 NOTE — Clinical Social Work Note (Signed)
CSW spoke with MOB.  MOB reports hx of anxiety was situational and no current concerns.  No significant symptoms within the last 3 years.    Patient was referred for history of depression/anxiety. * Referral screened out by Clinical Social Worker because none of the following criteria appear to apply: ~ History of anxiety/depression during this pregnancy, or of post-partum depression. ~ Diagnosis of anxiety and/or depression within last 3 years ~ History of depression due to pregnancy loss/loss of child  OR  * Patient's symptoms currently being treated with medication and/or therapy.  Please contact the Clinical Social Worker if needs arise, or by the patient's request.

## 2012-11-21 NOTE — Progress Notes (Signed)
CRITICAL VALUE ALERT  Critical value received: 6.4   Date of notification: 11/21/2012  Time of notification: 0730  Critical value read back:yes  Nurse who received alert:  Leary Roca RN  MD notified (1st page): Dr. Vincente Poli   Time of first page:  0740 MD notified (2nd page):  Time of second page:  Responding MD:   Time MD responded:

## 2012-11-21 NOTE — Progress Notes (Signed)
Subjective: Postpartum Day 2: Cesarean Delivery Patient reports incisional pain and tolerating PO.  Denies any heavy bleeding or any dizziness.  Objective: Vital signs in last 24 hours: Temp:  [98 F (36.7 C)-98.3 F (36.8 C)] 98 F (36.7 C) (06/20 1754) Pulse Rate:  [126-137] 126 (06/20 1754) Resp:  [18] 18 (06/20 1754) BP: (97-103)/(59-70) 101/59 mmHg (06/20 1754) SpO2:  [97 %-98 %] 98 % (06/20 1330)  Physical Exam:  General: alert, cooperative and appears stated age Lochia: appropriate Uterine Fundus: firm Incision: healing well, no significant drainage, no dehiscence, no significant erythema DVT Evaluation: No evidence of DVT seen on physical exam.   Recent Labs  11/20/12 0600  HGB 6.4*  HCT 19.7*    Assessment/Plan: Status post Cesarean section. Doing well postoperatively.  Continue current care No evidence of endometritis Anemia - patient is Jehovah's Witness - Anemia is currently asymptomatic except for some tachycardia .Will continue the iron supplementation and discussed iron rich foods   Nicole Woods L 11/21/2012, 6:09 AM

## 2012-11-22 MED ORDER — IBUPROFEN 800 MG PO TABS: 800.0000 mg | ORAL_TABLET | Freq: Three times a day (TID) | ORAL | Status: DC | PRN

## 2012-11-22 MED ORDER — OXYCODONE-ACETAMINOPHEN 5-325 MG PO TABS: 1.0000 | ORAL_TABLET | Freq: Four times a day (QID) | ORAL | Status: DC | PRN

## 2012-11-22 MED ORDER — FERROUS SULFATE 325 (65 FE) MG PO TABS: 325.0000 mg | ORAL_TABLET | Freq: Three times a day (TID) | ORAL | Status: DC

## 2012-11-22 NOTE — Progress Notes (Signed)
Subjective: Postpartum Day 3: Cesarean Delivery Patient reports incisional pain, tolerating PO and no problems voiding.  Received Feraheme yesterday  Objective: Vital signs in last 24 hours: Temp:  [97.7 F (36.5 C)-98.4 F (36.9 C)] 98.4 F (36.9 C) (06/22 0605) Pulse Rate:  [109-114] 109 (06/22 0605) Resp:  [18-20] 20 (06/22 0605) BP: (113-122)/(75-78) 113/75 mmHg (06/22 1610)  Physical Exam:  General: alert, cooperative and appears stated age Lochia: appropriate Uterine Fundus: firm Incision: healing well, no significant drainage, no dehiscence, no significant erythema DVT Evaluation: No evidence of DVT seen on physical exam.   Recent Labs  11/20/12 0600 11/21/12 0704  HGB 6.4* 6.4*  HCT 19.7* 19.8*    Assessment/Plan: Status post Cesarean section. Doing well postoperatively.  Discharge home with standard precautions and return to clinicin 1 week.  Voncille Simm L 11/22/2012, 6:35 AM

## 2012-11-22 NOTE — Discharge Summary (Signed)
Obstetric Discharge Summary Reason for Admission: induction of labor Prenatal Procedures: none Intrapartum Procedures: cesarean: low cervical, transverse Postpartum Procedures: antibiotics Complications-Operative and Postpartum: none Hemoglobin  Date Value Range Status  11/21/2012 6.4* 12.0 - 15.0 g/dL Final     REPEATED TO VERIFY     CRITICAL RESULT CALLED TO, READ BACK BY AND VERIFIED WITH:     TIA HART RN.@0730  ON 11/21/12 BY CALDWELL T     HCT  Date Value Range Status  11/21/2012 19.8* 36.0 - 46.0 % Final    Physical Exam:  General: alert, cooperative and appears stated age 22: appropriate Uterine Fundus: firm Incision: healing well, no significant drainage, no dehiscence, no significant erythema DVT Evaluation: No evidence of DVT seen on physical exam.  Discharge Diagnoses: Term Pregnancy-delivered and Amnionitis  Discharge Information: Date: 11/22/2012 Activity: pelvic rest Diet: routine Medications: Ibuprofen and Percocet Condition: improved Instructions: refer to practice specific booklet Discharge to: home   Newborn Data: Live born female  Birth Weight: 7 lb 11.1 oz (3490 g) APGAR: 9, 9  Home with mother.  Erron Wengert L 11/22/2012, 6:36 AM

## 2012-11-26 ENCOUNTER — Encounter (HOSPITAL_COMMUNITY): Payer: Self-pay | Admitting: *Deleted

## 2012-11-26 ENCOUNTER — Emergency Department (HOSPITAL_COMMUNITY): Payer: 59

## 2012-11-26 ENCOUNTER — Inpatient Hospital Stay (HOSPITAL_COMMUNITY)
Admission: EM | Admit: 2012-11-26 | Discharge: 2012-11-30 | DRG: 194 | Disposition: A | Discharge status: Home / Self Care | Payer: 59 | Attending: Internal Medicine | Admitting: Internal Medicine

## 2012-11-26 DIAGNOSIS — D509 Iron deficiency anemia, unspecified: Secondary | ICD-10-CM | POA: Diagnosis present

## 2012-11-26 DIAGNOSIS — R51 Headache: Secondary | ICD-10-CM | POA: Diagnosis present

## 2012-11-26 DIAGNOSIS — D649 Anemia, unspecified: Secondary | ICD-10-CM | POA: Diagnosis present

## 2012-11-26 DIAGNOSIS — N390 Urinary tract infection, site not specified: Secondary | ICD-10-CM

## 2012-11-26 DIAGNOSIS — B964 Proteus (mirabilis) (morganii) as the cause of diseases classified elsewhere: Secondary | ICD-10-CM | POA: Diagnosis present

## 2012-11-26 DIAGNOSIS — Z9889 Other specified postprocedural states: Secondary | ICD-10-CM

## 2012-11-26 DIAGNOSIS — IMO0001 Reserved for inherently not codable concepts without codable children: Secondary | ICD-10-CM

## 2012-11-26 DIAGNOSIS — O9089 Other complications of the puerperium, not elsewhere classified: Secondary | ICD-10-CM | POA: Diagnosis present

## 2012-11-26 DIAGNOSIS — J189 Pneumonia, unspecified organism: Principal | ICD-10-CM | POA: Diagnosis present

## 2012-11-26 DIAGNOSIS — Z531 Procedure and treatment not carried out because of patient's decision for reasons of belief and group pressure: Secondary | ICD-10-CM

## 2012-11-26 LAB — URINALYSIS, ROUTINE W REFLEX MICROSCOPIC
Bilirubin Urine: NEGATIVE
Ketones, ur: NEGATIVE mg/dL
Nitrite: NEGATIVE
Protein, ur: NEGATIVE mg/dL
pH: 7 (ref 5.0–8.0)

## 2012-11-26 LAB — BASIC METABOLIC PANEL
BUN: 9 mg/dL (ref 6–23)
Calcium: 8 mg/dL — ABNORMAL LOW (ref 8.4–10.5)
Chloride: 110 mEq/L (ref 96–112)
Creatinine, Ser: 0.69 mg/dL (ref 0.50–1.10)
GFR calc Af Amer: >90 mL/min (ref >90)
GFR calc non Af Amer: >90 mL/min (ref >90)

## 2012-11-26 LAB — CBC WITH DIFFERENTIAL/PLATELET
Basophils Absolute: 0 10*3/uL (ref 0.0–0.1)
Basophils Relative: 0 % (ref 0–1)
Eosinophils Absolute: 0.1 10*3/uL (ref 0.0–0.7)
Hemoglobin: 5.9 g/dL — CL (ref 12.0–15.0)
Lymphocytes Relative: 11 % — ABNORMAL LOW (ref 12–46)
MCH: 25.9 pg — ABNORMAL LOW (ref 26.0–34.0)
MCHC: 31.2 g/dL (ref 30.0–36.0)
Monocytes Absolute: 1 10*3/uL (ref 0.1–1.0)
Neutrophils Relative %: 81 % — ABNORMAL HIGH (ref 43–77)
Platelets: 402 10*3/uL — ABNORMAL HIGH (ref 150–400)
RDW: 18.1 % — ABNORMAL HIGH (ref 11.5–15.5)
WBC Morphology: INCREASED

## 2012-11-26 LAB — URINE MICROSCOPIC-ADD ON

## 2012-11-26 MED ORDER — DEXTROSE 5 % IV SOLN
1.0000 g | Freq: Three times a day (TID) | INTRAVENOUS | Status: DC
  Administered 2012-11-26 – 2012-11-30 (×11): 1 g via INTRAVENOUS
  Filled 2012-11-26 (×14): qty 1

## 2012-11-26 MED ORDER — FERROUS SULFATE 325 (65 FE) MG PO TABS
325.0000 mg | ORAL_TABLET | Freq: Three times a day (TID) | ORAL | Status: DC
  Administered 2012-11-26 – 2012-11-30 (×14): 325 mg via ORAL
  Filled 2012-11-26 (×18): qty 1

## 2012-11-26 MED ORDER — IBUPROFEN 800 MG PO TABS
800.0000 mg | ORAL_TABLET | Freq: Three times a day (TID) | ORAL | Status: DC | PRN
  Administered 2012-11-28: 800 mg via ORAL
  Filled 2012-11-26: qty 1

## 2012-11-26 MED ORDER — VANCOMYCIN HCL IN DEXTROSE 1-5 GM/200ML-% IV SOLN
1000.0000 mg | Freq: Once | INTRAVENOUS | Status: AC
  Administered 2012-11-26: 1000 mg via INTRAVENOUS
  Filled 2012-11-26: qty 200

## 2012-11-26 MED ORDER — MUPIROCIN 2 % EX OINT
1.0000 "application " | TOPICAL_OINTMENT | Freq: Two times a day (BID) | CUTANEOUS | Status: DC
  Administered 2012-11-26 – 2012-11-30 (×8): 1 via NASAL
  Filled 2012-11-26: qty 22

## 2012-11-26 MED ORDER — KETOROLAC TROMETHAMINE 30 MG/ML IJ SOLN
30.0000 mg | Freq: Once | INTRAMUSCULAR | Status: AC
  Administered 2012-11-26: 30 mg via INTRAVENOUS
  Filled 2012-11-26: qty 1

## 2012-11-26 MED ORDER — MUPIROCIN 2 % EX OINT: 1.0000 "application " | TOPICAL_OINTMENT | Freq: Two times a day (BID) | CUTANEOUS | Status: DC

## 2012-11-26 MED ORDER — PIPERACILLIN-TAZOBACTAM 3.375 G IVPB
3.3750 g | Freq: Once | INTRAVENOUS | Status: AC
  Administered 2012-11-26: 3.375 g via INTRAVENOUS
  Filled 2012-11-26: qty 50

## 2012-11-26 MED ORDER — SODIUM CHLORIDE 0.9 % IV BOLUS (SEPSIS)
1000.0000 mL | Freq: Once | INTRAVENOUS | Status: AC
  Administered 2012-11-26: 1000 mL via INTRAVENOUS

## 2012-11-26 MED ORDER — OXYCODONE-ACETAMINOPHEN 5-325 MG PO TABS
1.0000 | ORAL_TABLET | Freq: Four times a day (QID) | ORAL | Status: DC | PRN
  Administered 2012-11-26 – 2012-11-30 (×5): 1 via ORAL
  Filled 2012-11-26 (×6): qty 1

## 2012-11-26 MED ORDER — DIPHENHYDRAMINE HCL 50 MG/ML IJ SOLN
25.0000 mg | Freq: Once | INTRAMUSCULAR | Status: AC
  Administered 2012-11-26: 25 mg via INTRAVENOUS
  Filled 2012-11-26: qty 1

## 2012-11-26 MED ORDER — VANCOMYCIN HCL IN DEXTROSE 1-5 GM/200ML-% IV SOLN
1000.0000 mg | Freq: Two times a day (BID) | INTRAVENOUS | Status: DC
  Administered 2012-11-26 – 2012-11-29 (×7): 1000 mg via INTRAVENOUS
  Filled 2012-11-26 (×9): qty 200

## 2012-11-26 MED ORDER — METOCLOPRAMIDE HCL 5 MG/ML IJ SOLN
10.0000 mg | Freq: Once | INTRAMUSCULAR | Status: AC
  Administered 2012-11-26: 10 mg via INTRAVENOUS
  Filled 2012-11-26 (×2): qty 2

## 2012-11-26 MED ORDER — CHLORHEXIDINE GLUCONATE CLOTH 2 % EX PADS
6.0000 | MEDICATED_PAD | Freq: Every day | CUTANEOUS | Status: DC
  Administered 2012-11-27 – 2012-11-30 (×4): 6 via TOPICAL

## 2012-11-26 NOTE — ED Notes (Addendum)
Pt arrives by EMS from home with c/o HA x's 3 days. Reports pain in neck. Has hx of migraines. Pt also has hx of seizures. Pt had c-section last Thursday without complications. Pt was given 30mg  toradol and 4mg  zofran in route.

## 2012-11-26 NOTE — H&P (Signed)
[facility] History and Physical  Nicole Woods ZOX:[id number] DOB: Oct 17, 1990 DOA: 11/26/2012  Referring physician: Dr. Denton Lank PCP: Default, Provider, MD  Specialists: Dr. Mitchel Honour  Chief Complaint: Headache, cough  HPI: Nicole Woods is a 22 y.o. female  That presented to the ED complaining of headache and cough.  The headache has been present for the last day. Nothing she is aware of makes it better.  She started coughing 2 days ago and noticed it more at night. Not associated with hemoptysis. The cough has been non productive. The patient denies feeling short of breath.  She reportedly had C section and delivered one child ~ 19th.  She is Jehovah's Witness and refuses all blood products.  In the Ed pt was found to have a pneumonia diagnosed with chest x ray.  Also was found to have a hgb of 5.9.  Review of Systems: 10 point review of system reviewed and negative unless otherwise mentioned above.  Past Medical History  Diagnosis Date  . Hx of varicella   . 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 No past surgeries    . Cesarean section N/A 11/19/2012    Procedure: CESAREAN SECTION;  Surgeon: Meriel Pica, MD;  Location: [facility];  Service: Obstetrics;  Laterality: N/A;   Social History:  reports that she has never smoked. She has never used smokeless tobacco. She reports that she does not drink alcohol or use illicit drugs. Lives at home  Can patient participate in ADLs? yes  Allergies  Allergen Reactions  . Other     No blood products. Pt is of Jehovah Witness Faith.    Family History  Problem Relation Age of Onset  . Asthma Neg Hx   . Birth defects Neg Hx   . Cancer Neg Hx   . Depression Neg Hx   . Diabetes Neg Hx   . Hearing loss Neg Hx   . Heart disease Neg Hx   . Hyperlipidemia Neg Hx   . Hypertension Neg Hx   . Kidney disease Neg Hx   .  Miscarriages / Stillbirths Neg Hx   . Stroke Neg Hx   . Arthritis Neg Hx   . Alcohol abuse Neg Hx   . COPD Neg Hx   . Drug abuse Neg Hx   . Early death Neg Hx   . Learning disabilities Neg Hx   . Mental illness Neg Hx   . Mental retardation Neg Hx   . Vision loss Neg Hx   None other reported  Prior to Admission medications   Medication Sig Start Date End Date Taking? Authorizing Provider  acetaminophen (TYLENOL) 500 MG tablet Take 1,000 mg by mouth every 6 (six) hours as needed for pain.   Yes Historical Provider, MD  ferrous sulfate 325 (65 FE) MG tablet Take 1 tablet (325 mg total) by mouth 3 (three) times daily with meals. 11/22/12  Yes Jeani Hawking, MD  ibuprofen (ADVIL,MOTRIN) 800 MG tablet Take 1 tablet (800 mg total) by mouth every 8 (eight) hours as needed. 11/22/12  Yes Jeani Hawking, MD  oxyCODONE-acetaminophen (PERCOCET/ROXICET) 5-325 MG per tablet Take 1-2 tablets by mouth every 6 (six) hours as needed. 11/22/12  Yes Jeani Hawking, MD   Physical Exam: Filed Vitals:   11/26/12 1334 11/26/12 1441  BP: 129/72 122/80  Pulse: 85 82  Temp: 99.6 F (37.6 C) 100.1 F (37.8 C)  TempSrc: Oral Oral  Resp: 27 20  SpO2: 87% 95%     General:  Pt in NAD, Alert and awake  Eyes: EOMI, non icteric, conjunctival pallor  ENT: normal exterior appearance, no masses on visual examination  Neck: supple, no goiter  Cardiovascular: RRR, no MRG  Respiratory: CTA BL,no wheezes  Abdomen: soft, NT, ND  Skin: Warm and dry  Musculoskeletal: no cyanosis or clubbing  Psychiatric: mood and affect appropriate  Neurologic: answers questions appropriately and moves all extremities.  Labs on Admission:  Basic Metabolic Panel:  Recent Labs Lab 11/26/12 1545  NA 141  K 3.8  CL 110  CO2 19  GLUCOSE 72  BUN 9  CREATININE 0.69  CALCIUM 8.0*   Liver Function Tests: No results found for this basename: AST, ALT, ALKPHOS, BILITOT, PROT, ALBUMIN,  in the last 168  hours No results found for this basename: LIPASE, AMYLASE,  in the last 168 hours No results found for this basename: AMMONIA,  in the last 168 hours CBC:  Recent Labs Lab 11/20/12 0600 11/21/12 0704 11/26/12 1545  WBC 23.4* 18.7* 14.2*  NEUTROABS  --   --  11.5*  HGB 6.4* 6.4* 5.9*  HCT 19.7* 19.8* 18.9*  MCV 79.4 79.5 82.9  PLT 231 257 402*   Cardiac Enzymes: No results found for this basename: CKTOTAL, CKMB, CKMBINDEX, TROPONINI,  in the last 168 hours  BNP (last 3 results) No results found for this basename: PROBNP,  in the last 8760 hours CBG: No results found for this basename: GLUCAP,  in the last 168 hours  Radiological Exams on Admission: Dg Chest 2 View  11/26/2012   *RADIOLOGY REPORT*  Clinical Data: Fever and shortness of breath.  CHEST - 2 VIEW  Comparison: None.  Findings: There is a faint right perihilar infiltrate extending into the right lower lobe.  Left lung is clear.  Heart size and vascularity are normal.  No acute osseous abnormality.  No effusions.  IMPRESSION: Findings consistent with a right perihilar right lower lobe infiltrate.  The markings may be at least in part accentuated by the shallow inspiration.   Original Report Authenticated By: Francene Boyers, M.D.    Assessment/Plan Active Problems:   1. Anemia/ c refusal of blood transfusions due to religious causes - Discussed case with hematologist Dr. Myrle Sheng at this point patient has microcytic anemia and currently lab work looks like patient has iron deficiency anemia.  Plan will be to place patient on Iron supplement qid. Epogen not indicated as will take time and will not benefit patient acutely at this juncture. - Limit blood draws and recheck cbc in 2 days - To consider consulting hematologist should patient's condition not improve despite iron supplementation. - transfer to step down   2. HAP - Given recent hospitalization most will treat as HAP - Place order for sputum culture - as  mentioned above we will limit blood draws as such will not check blood culture or HIV. Patient aware and agreeable.   Code Status: full Family Communication: Discussed with patient and family at bedside Disposition Plan: Pending improvement in condition.  Time spent: > 60 minutes  Penny Pia [facility] Pager [phone]  If 7PM-7AM, please contact night-coverage [email or web address] Password [facility] 11/26/2012, 5:58 PM

## 2012-11-26 NOTE — Progress Notes (Signed)
[facility] progress note. Chief complaint. Anemia. This 22 year old female in hospital with healthcare acquired pneumonia was initiated to have a hemoglobin of 5.9. Because of her religious preference, Jehovah's Witness, the patient declined blood products. I was called by nursing informs that the patient had changed her mind and now did wish transfusion of blood products. I came to the bedside to ensure that the patient understood the ramifications of this decision and understood that she would be receiving blood products intravenously. I'm satisfied that the patient is competent and with full understanding wishes to be transfused packed red blood cells. I have subsequently placed orders for transfusion and nursing will obtain informed consent.

## 2012-11-26 NOTE — ED Notes (Signed)
[id number]<YN> Expected date:<BR> Expected time:<BR> Means of arrival:<BR> Comments:<BR> ems

## 2012-11-26 NOTE — ED Notes (Signed)
PA at bedside.

## 2012-11-26 NOTE — Progress Notes (Signed)
ANTIBIOTIC CONSULT NOTE - INITIAL  Pharmacy Consult for vancomycin/pharmacy to adjust antibiotics  Indication: vancomycin (cefepime)  Allergies  Allergen Reactions  . Other     No blood products. Pt is of Jehovah Witness Faith.    Patient Measurements:   Adjusted Body Weight:   Vital Signs: Temp: 100.1 F (37.8 C) (06/26 1441) Temp src: Oral (06/26 1441) BP: 136/82 mmHg (06/26 1820) Pulse Rate: 75 (06/26 1820) Intake/Output from previous day:   Intake/Output from this shift: Total I/O In: 200 [IV Piggyback:200] Out: -   Labs:  Recent Labs  11/26/12 1545  WBC 14.2*  HGB 5.9*  PLT 402*  CREATININE 0.69   The CrCl is unknown because both a height and weight (above a minimum accepted value) are required for this calculation. No results found for this basename: VANCOTROUGH, Leodis Binet, VANCORANDOM, GENTTROUGH, GENTPEAK, GENTRANDOM, TOBRATROUGH, TOBRAPEAK, TOBRARND, AMIKACINPEAK, AMIKACINTROU, AMIKACIN,  in the last 72 hours   Microbiology: Recent Results (from the past 720 hour(s))  OB RESULTS CONSOLE GBS     Status: None   Collection Time    11/09/12 12:00 AM      Result Value Range Status   GBS Negative   Final    Medical History: Past Medical History  Diagnosis Date  . Hx of varicella   . 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  She is s/p C-section 6/19 and is breastfeeding.  She is found to have infiltrate on CXR and starting antibiotics for pneumonia  6/26 >>vancomycin  >> 6/26  >> cefepime  >>    Tmax: 100.1 WBCs: 14.2 Renal: Scr = 0.69  6/26 urine: pending 6/26 sputum: ordered  Dose changes/drug level info:    Goal of Therapy:  Vancomycin trough level 15-20 mcg/ml  Plan:   Vancomycin 1gm given in ED (given at 17:00), give vancomycin 1gm IV x 1 now then 1000mg  IV q12h  Continue cefepime 1gm IV q8h  Check Css vancomycin  trough if continues   Juliette Alcide, PharmD, BCPS.   Pager: [phone]  11/26/2012,6:52 PM

## 2012-11-26 NOTE — ED Provider Notes (Signed)
History    CSN: [id number] Arrival date & time 11/26/12  1328  First MD Initiated Contact with Patient 11/26/12 1421     Chief Complaint  Patient presents with  . Headache   (Consider location/radiation/quality/duration/timing/severity/associated sxs/prior Treatment) HPI Comments: Patient reports that she began having a frontal headache four days ago.  Headache has been constant since that time.  She reports that the headache feels like a throbbing pain.  She took Percocet and Tylenol for the pain without relief.  She reports that the headache feels similar to headaches that she has had in the past.  No acute trama to the head.  She reports that the headache does not change with position.  She denies visual changes, nausea, vomiting.  She reports that today she developed a fever.   Today she also began feeling short of breath and also has had a cough.  Patient had a C-Section performed on 11/19/12 and was hospitalized at that time.  She reports that she has had some vaginally bleeding since that time.  She reports that the bleeding has improved.  She states that she is changing a tampon 2-3 times daily and that the tampon is not saturated when she changes it.  She denies any urinary changes.  Denies abdominal pain.    Patient is a 22 y.o. female presenting with headaches. The history is provided by the patient.  Headache Pain location:  Frontal Radiates to:  Does not radiate Onset quality:  Gradual Associated symptoms: cough and fever    Past Medical History  Diagnosis Date  . Hx of varicella   . 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 No past surgeries    . Cesarean section N/A 11/19/2012    Procedure: CESAREAN SECTION;  Surgeon: Meriel Pica, MD;  Location: [facility];  Service: Obstetrics;  Laterality: N/A;   Family History  Problem Relation Age of Onset  . Asthma Neg  Hx   . Birth defects Neg Hx   . Cancer Neg Hx   . Depression Neg Hx   . Diabetes Neg Hx   . Hearing loss Neg Hx   . Heart disease Neg Hx   . Hyperlipidemia Neg Hx   . Hypertension Neg Hx   . Kidney disease Neg Hx   . Miscarriages / Stillbirths Neg Hx   . Stroke Neg Hx   . Arthritis Neg Hx   . Alcohol abuse Neg Hx   . COPD Neg Hx   . Drug abuse Neg Hx   . Early death Neg Hx   . Learning disabilities Neg Hx   . Mental illness Neg Hx   . Mental retardation Neg Hx   . Vision loss Neg Hx    History  Substance Use Topics  . Smoking status: Never Smoker   . Smokeless tobacco: Never Used  . Alcohol Use: No   OB History   Grav Para Term Preterm Abortions TAB SAB Ect Mult Living   1 1 1       1      Review of Systems  Constitutional: Positive for fever and chills.  Respiratory: Positive for cough and shortness of breath.   Neurological: Positive for headaches.  All other systems reviewed and are negative.    Allergies  Review of patient's allergies indicates no known allergies.  Home  Medications   Current Outpatient Rx  Name  Route  Sig  Dispense  Refill  . acetaminophen (TYLENOL) 500 MG tablet   Oral   Take 1,000 mg by mouth every 6 (six) hours as needed for pain.         . ferrous sulfate 325 (65 FE) MG tablet   Oral   Take 1 tablet (325 mg total) by mouth 3 (three) times daily with meals.   60 tablet   0   . ibuprofen (ADVIL,MOTRIN) 800 MG tablet   Oral   Take 1 tablet (800 mg total) by mouth every 8 (eight) hours as needed.   30 tablet   0   . oxyCODONE-acetaminophen (PERCOCET/ROXICET) 5-325 MG per tablet   Oral   Take 1-2 tablets by mouth every 6 (six) hours as needed.   30 tablet   0    BP 122/80  Pulse 82  Temp(Src) 100.1 F (37.8 C) (Oral)  Resp 20  SpO2 95% Physical Exam  Nursing note and vitals reviewed. Constitutional: She appears well-developed and well-nourished. No distress.  HENT:  Head: Normocephalic and atraumatic.   Mouth/Throat: Oropharynx is clear and moist.  Eyes: EOM are normal. Pupils are equal, round, and reactive to light.  Neck: Normal range of motion. Neck supple. No Brudzinski's sign and no Kernig's sign noted.  Cardiovascular: Normal rate, regular rhythm and normal heart sounds.   Pulmonary/Chest: Effort normal and breath sounds normal. No respiratory distress. She has no wheezes. She has no rales.  Abdominal: Soft. She exhibits no distension. There is no tenderness. There is no rebound and no guarding.  Abdominal incision appears to be healing well.  Skin well approximated.  No wound dehiscence.  No drainage.  No surrounding edema or erythema.  Musculoskeletal: Normal range of motion.  Neurological: She is alert. She has normal strength. No cranial nerve deficit or sensory deficit. Coordination and gait normal.  Normal finger to nose testing Normal rapid alternating movements.  Skin: Skin is warm and dry. No rash noted. She is not diaphoretic.  Psychiatric: She has a normal mood and affect.    ED Course  Procedures (including critical care time) Labs Reviewed  CBC WITH DIFFERENTIAL  BASIC METABOLIC PANEL  URINALYSIS, ROUTINE W REFLEX MICROSCOPIC   Dg Chest 2 View  11/26/2012   *RADIOLOGY REPORT*  Clinical Data: Fever and shortness of breath.  CHEST - 2 VIEW  Comparison: None.  Findings: There is a faint right perihilar infiltrate extending into the right lower lobe.  Left lung is clear.  Heart size and vascularity are normal.  No acute osseous abnormality.  No effusions.  IMPRESSION: Findings consistent with a right perihilar right lower lobe infiltrate.  The markings may be at least in part accentuated by the shallow inspiration.   Original Report Authenticated By: Francene Boyers, M.D.   No diagnosis found.  5:00 PM Patient's Hemoglobin found to be 5.9.  Patient is Jehovah's Witness and has declined blood transfusion at this time.  Patient explained the risks.  Patient verbalizes  understanding.    Discussed with Dr. Cena Benton with [facility] who has agreed to admit the patient.    MDM  Patient presents with a chief complaint of SOB, hypoxia, cough, and fever.  CXR showing a RLL Pneumonia.  Due to the fact that the patient has been recently hospitalized for her C-Section on 11/19/12 the patient treated for HAP with Vancomycin and Zosyn.  Patient also found to have a Hemoglobin of 5.9.  She is Jehovah's Witness and has declined blood transfusion at this time.  Patient also complaining of a headache.  Full ROM of the neck.  No signs of meningitis at this time.  Patient admitted to [facility] service.    Pascal Lux Galloway, PA-C 11/27/12 0008

## 2012-11-27 DIAGNOSIS — D649 Anemia, unspecified: Secondary | ICD-10-CM

## 2012-11-27 DIAGNOSIS — J189 Pneumonia, unspecified organism: Principal | ICD-10-CM

## 2012-11-27 LAB — STREP PNEUMONIAE URINARY ANTIGEN: Strep Pneumo Urinary Antigen: NEGATIVE

## 2012-11-27 LAB — LEGIONELLA ANTIGEN, URINE: Legionella Antigen, Urine: NEGATIVE

## 2012-11-27 MED ORDER — ONDANSETRON HCL 4 MG/2ML IJ SOLN
4.0000 mg | Freq: Four times a day (QID) | INTRAMUSCULAR | Status: DC | PRN
  Administered 2012-11-27: 4 mg via INTRAVENOUS
  Filled 2012-11-27: qty 2

## 2012-11-27 MED ORDER — ADULT MULTIVITAMIN W/MINERALS CH
1.0000 | ORAL_TABLET | Freq: Every day | ORAL | Status: DC
  Administered 2012-11-27 – 2012-11-30 (×4): 1 via ORAL
  Filled 2012-11-27 (×5): qty 1

## 2012-11-27 MED ORDER — BIOTENE DRY MOUTH MT LIQD
15.0000 mL | Freq: Two times a day (BID) | OROMUCOSAL | Status: DC
  Administered 2012-11-27 – 2012-11-30 (×4): 15 mL via OROMUCOSAL

## 2012-11-27 NOTE — Progress Notes (Signed)
CARE MANAGEMENT NOTE 11/27/2012  Patient:  [facility]   Account Number:  [id number]  Date Initiated:  11/27/2012  Documentation initiated by:  DAVIS,RHONDA  Subjective/Objective Assessment:   pt with recent history of fetal delivery,hgb 5.6, pna by cxr, dypnea, fever.     Action/Plan:   PATIENT IS A JEOVAH WITNESS/but has consented to transfusions.   Anticipated DC Date:  11/30/2012   Anticipated DC Plan:  HOME/SELF CARE  In-house referral  NA      DC Planning Services  NA      [facility] Choice  NA   Choice offered to / List presented to:  NA   DME arranged  NA      DME agency  NA     HH arranged  NA      HH agency  NA   Status of service:  In process, will continue to follow Medicare Important Message given?  NA - LOS <3 / Initial given by admissions (If response is "NO", the following Medicare IM given date fields will be blank) Date Medicare IM given:   Date Additional Medicare IM given:    Discharge Disposition:    Per UR Regulation:  Reviewed for med. necessity/level of care/duration of stay  If discussed at Long Length of Stay Meetings, dates discussed:    Comments:  [id number]/Rhonda Earlene Plater, RN, BSN, CCM:  CHART REVIEWED AND UPDATED.  Next chart review due on [id number]. NO DISCHARGE NEEDS PRESENT AT THIS TIME. CASE MANAGEMENT [phone]

## 2012-11-27 NOTE — Progress Notes (Signed)
Nutrition Brief Note  Patient identified on the Malnutrition Screening Tool (MST) Report  Body mass index is 42.56 kg/(m^2). 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   Current diet order is regular, patient is consuming approximately 75% of meals at this time. Labs and medications reviewed. Pt s/p C-section 6/19. Pt reports eating well during admission and PTA with good appetite. Pt reports taking multivitamin at home, getting adequate calcium, and staying hydrated. Pt without any nutritional concerns. Will order multivitamin.   No nutrition interventions warranted at this time. If nutrition issues arise, please consult RD.   Levon Hedger MS, RD, LDN [phone] Pager [phone] After Hours Pager

## 2012-11-27 NOTE — Progress Notes (Signed)
[facility] PROGRESS NOTE  Nicole Woods ZOX:[id number] DOB: Dec 24, 1990 DOA: 11/26/2012 PCP: Default, Provider, MD  Assessment/Plan: 1. Health Care Associated PNA: Continue with vancomycin and cefepime day 2. Sputum culture ordered. Strep antigen negative.  2. Anemia: likely iron deficiency in setting of recent pregnancy, C section. Patient agree to have 2 units of PRBC. Continue with iron supplement.  3. UTI: UA with 21 to 50 WBC. Cefepime should cover for UTI. Follow urine culture.  4. Recent C section, Patient has decide not to  breast feeding.  5. Headache: Almost resolved.   Code Status: Full Code.  Family Communication: Care discussed with patient and fiance.  Disposition Plan: remain inpatient.    Consultants:  none  Procedures:  none  Antibiotics:  Vancomycin 6-26  Cefepime 6-26  HPI/Subjective: Coughing more, productive cough. No worsening dyspnea.  Headache much better.     Objective: Filed Vitals:   11/27/12 0600 11/27/12 0610 11/27/12 0700 11/27/12 0710  BP: 159/99 159/99  151/95  Pulse: 75 79 63 69  Temp:  99.4 F (37.4 C)  98.9 F (37.2 C)  TempSrc:  Oral  Oral  Resp: 29 32 30 30  Height:      Weight:      SpO2: 97%  95%     Intake/Output Summary (Last 24 hours) at 11/27/12 0742 Last data filed at 11/27/12 0600  Gross per 24 hour  Intake    917 ml  Output   1350 ml  Net   -433 ml   Filed Weights   11/26/12 2100  Weight: 116 kg (255 lb 11.7 oz)    Exam:   General:  No distress.   Cardiovascular: S 1, S 2 RRR  Respiratory: Crackles right side.   Abdomen: BS present, soft, NT, incision from C section healing.   Musculoskeletal: no edema.   Data Reviewed: Basic Metabolic Panel:  Recent Labs Lab 11/26/12 1545  NA 141  K 3.8  CL 110  CO2 19  GLUCOSE 72  BUN 9  CREATININE 0.69  CALCIUM 8.0*   Liver Function Tests: No results found for this basename: AST, ALT, ALKPHOS, BILITOT, PROT, ALBUMIN,  in the last 168  hours No results found for this basename: LIPASE, AMYLASE,  in the last 168 hours No results found for this basename: AMMONIA,  in the last 168 hours CBC:  Recent Labs Lab 11/21/12 0704 11/26/12 1545  WBC 18.7* 14.2*  NEUTROABS  --  11.5*  HGB 6.4* 5.9*  HCT 19.8* 18.9*  MCV 79.5 82.9  PLT 257 402*   Cardiac Enzymes: No results found for this basename: CKTOTAL, CKMB, CKMBINDEX, TROPONINI,  in the last 168 hours BNP (last 3 results) No results found for this basename: PROBNP,  in the last 8760 hours CBG: No results found for this basename: GLUCAP,  in the last 168 hours  Recent Results (from the past 240 hour(s))  MRSA PCR SCREENING     Status: Abnormal   Collection Time    11/26/12  6:27 PM      Result Value Range Status   MRSA by PCR POSITIVE (*) NEGATIVE Final   Comment:            The GeneXpert MRSA Assay (FDA     approved for NASAL specimens     only), is one component of a     comprehensive MRSA colonization     surveillance program. It is not     intended to diagnose MRSA  infection nor to guide or     monitor treatment for     MRSA infections.     RESULT CALLED TO, READ BACK BY AND VERIFIED WITH:     STERN,R. AT 2028 ON 06.26.14 BY LOVE,T.     Studies: Dg Chest 2 View  11/26/2012   *RADIOLOGY REPORT*  Clinical Data: Fever and shortness of breath.  CHEST - 2 VIEW  Comparison: None.  Findings: There is a faint right perihilar infiltrate extending into the right lower lobe.  Left lung is clear.  Heart size and vascularity are normal.  No acute osseous abnormality.  No effusions.  IMPRESSION: Findings consistent with a right perihilar right lower lobe infiltrate.  The markings may be at least in part accentuated by the shallow inspiration.   Original Report Authenticated By: Francene Boyers, M.D.    Scheduled Meds: . ceFEPime (MAXIPIME) IV  1 g Intravenous Q8H  . Chlorhexidine Gluconate Cloth  6 each Topical Q0600  . ferrous sulfate  325 mg Oral TID PC & HS  .  mupirocin ointment  1 application Nasal BID  . vancomycin  1,000 mg Intravenous Q12H   Continuous Infusions:   Principal Problem:   Anemia Active Problems:   Healthcare-associated pneumonia   Refusal of blood transfusions as patient is Jehovah's Witness    Time spent: 35 minutes.     Nicole Woods  [facility] Pager [phone]. If 7PM-7AM, please contact night-coverage at [email or web address], password [facility] 11/27/2012, 7:42 AM  LOS: 1 day

## 2012-11-28 LAB — URINE CULTURE: Colony Count: >=100000

## 2012-11-28 LAB — TYPE AND SCREEN
ABO/RH(D): O POS
Antibody Screen: NEGATIVE
Unit division: 0

## 2012-11-28 LAB — CBC
Platelets: 415 10*3/uL — ABNORMAL HIGH (ref 150–400)
RDW: 18.5 % — ABNORMAL HIGH (ref 11.5–15.5)
WBC: 15 10*3/uL — ABNORMAL HIGH (ref 4.0–10.5)

## 2012-11-28 LAB — BASIC METABOLIC PANEL
Chloride: 107 mEq/L (ref 96–112)
GFR calc Af Amer: >90 mL/min (ref >90)
GFR calc non Af Amer: >90 mL/min (ref >90)
Potassium: 4.1 mEq/L (ref 3.5–5.1)
Sodium: 138 mEq/L (ref 135–145)

## 2012-11-28 MED ORDER — SENNOSIDES-DOCUSATE SODIUM 8.6-50 MG PO TABS
1.0000 | ORAL_TABLET | Freq: Two times a day (BID) | ORAL | Status: DC
  Administered 2012-11-28 – 2012-11-30 (×5): 1 via ORAL
  Filled 2012-11-28 (×6): qty 1

## 2012-11-28 MED ORDER — HEPARIN SODIUM (PORCINE) 5000 UNIT/ML IJ SOLN
5000.0000 [IU] | Freq: Three times a day (TID) | INTRAMUSCULAR | Status: DC
  Administered 2012-11-28 – 2012-11-30 (×6): 5000 [IU] via SUBCUTANEOUS
  Filled 2012-11-28 (×9): qty 1

## 2012-11-28 NOTE — Progress Notes (Signed)
[facility] PROGRESS NOTE  Nicole Woods:[id number] DOB: Oct 31, 1990 DOA: 11/26/2012 PCP: Default, Provider, MD  Assessment/Plan: 1. Health Care Associated PNA: Continue with vancomycin and cefepime day 3. Sputum culture ordered. Strep and legionella antigen negative.  2. Anemia: likely iron deficiency in setting of recent pregnancy, C section. Patient received 2 units of PRBC 6-26. Continue with iron supplement.  3. UTI: UA with 21 to 50 WBC. Cefepime should cover for UTI. Follow urine culture: Gram negative rods.  4. Recent C section, Patient has decide not to  breast feeding.  5. Headache: Almost resolved. Normal CT of the head without contrast. 6. DVT Prophylaxis: Patient at risk for DVT due to recent Sx, pregnancy. She only relates very small amount of vaginal bleed. She agree to anticoagulation, and she understand that Hb can decrease and she could require more transfusion. Will start heparin.   Code Status: Full Code.  Family Communication: Care discussed with patient and fiance.  Disposition Plan: remain inpatient.    Consultants:  none  Procedures:  none  Antibiotics:  Vancomycin 6-26  Cefepime 6-26  HPI/Subjective: Feeling better, breathing better.    Objective: Filed Vitals:   11/28/12 0300 11/28/12 0400 11/28/12 0500 11/28/12 0600  BP:  133/60    Pulse: 62 79 34 99  Temp:  99.1 F (37.3 C)    TempSrc:  Oral    Resp: 31 29 23 23   Height:      Weight:      SpO2: 100% 99%  98%    Intake/Output Summary (Last 24 hours) at 11/28/12 0737 Last data filed at 11/28/12 0631  Gross per 24 hour  Intake   1775 ml  Output   4050 ml  Net  -2275 ml   Filed Weights   11/26/12 2100  Weight: 116 kg (255 lb 11.7 oz)    Exam:   General:  No distress.   Cardiovascular: S 1, S 2 RRR  Respiratory: Crackles right side.   Abdomen: BS present, soft, NT, incision from C section healing.   Musculoskeletal: no edema.   Data Reviewed: Basic Metabolic  Panel:  Recent Labs Lab 11/26/12 1545 11/28/12 0350  NA 141 138  K 3.8 4.1  CL 110 107  CO2 19 22  GLUCOSE 72 89  BUN 9 9  CREATININE 0.69 0.76  CALCIUM 8.0* 9.0   Liver Function Tests: No results found for this basename: AST, ALT, ALKPHOS, BILITOT, PROT, ALBUMIN,  in the last 168 hours No results found for this basename: LIPASE, AMYLASE,  in the last 168 hours No results found for this basename: AMMONIA,  in the last 168 hours CBC:  Recent Labs Lab 11/26/12 1545 11/28/12 0350  WBC 14.2* 15.0*  NEUTROABS 11.5*  --   HGB 5.9* 8.5*  HCT 18.9* 27.1*  MCV 82.9 83.6  PLT 402* 415*   Cardiac Enzymes: No results found for this basename: CKTOTAL, CKMB, CKMBINDEX, TROPONINI,  in the last 168 hours BNP (last 3 results) No results found for this basename: PROBNP,  in the last 8760 hours CBG: No results found for this basename: GLUCAP,  in the last 168 hours  Recent Results (from the past 240 hour(s))  URINE CULTURE     Status: None   Collection Time    11/26/12  3:43 PM      Result Value Range Status   Specimen Description URINE, CLEAN CATCH   Final   Special Requests NONE   Final   Culture  Setup Time  11/26/2012 22:11   Final   Colony Count >=100,000 COLONIES/ML   Final   Culture GRAM NEGATIVE RODS   Final   Report Status PENDING   Incomplete  MRSA PCR SCREENING     Status: Abnormal   Collection Time    11/26/12  6:27 PM      Result Value Range Status   MRSA by PCR POSITIVE (*) NEGATIVE Final   Comment:            The GeneXpert MRSA Assay (FDA     approved for NASAL specimens     only), is one component of a     comprehensive MRSA colonization     surveillance program. It is not     intended to diagnose MRSA     infection nor to guide or     monitor treatment for     MRSA infections.     RESULT CALLED TO, READ BACK BY AND VERIFIED WITH:     STERN,R. AT 2028 ON 06.26.14 BY LOVE,T.     Studies: Dg Chest 2 View  11/26/2012   *RADIOLOGY REPORT*  Clinical Data:  Fever and shortness of breath.  CHEST - 2 VIEW  Comparison: None.  Findings: There is a faint right perihilar infiltrate extending into the right lower lobe.  Left lung is clear.  Heart size and vascularity are normal.  No acute osseous abnormality.  No effusions.  IMPRESSION: Findings consistent with a right perihilar right lower lobe infiltrate.  The markings may be at least in part accentuated by the shallow inspiration.   Original Report Authenticated By: Francene Boyers, M.D.    Scheduled Meds: . antiseptic oral rinse  15 mL Mouth Rinse BID  . ceFEPime (MAXIPIME) IV  1 g Intravenous Q8H  . Chlorhexidine Gluconate Cloth  6 each Topical Q0600  . ferrous sulfate  325 mg Oral TID PC & HS  . heparin subcutaneous  5,000 Units Subcutaneous Q8H  . multivitamin with minerals  1 tablet Oral Daily  . mupirocin ointment  1 application Nasal BID  . vancomycin  1,000 mg Intravenous Q12H   Continuous Infusions:   Principal Problem:   Anemia Active Problems:   Healthcare-associated pneumonia   Refusal of blood transfusions as patient is Jehovah's Witness    Time spent: 35 minutes.     Nicole Woods  [facility] Pager [phone]. If 7PM-7AM, please contact night-coverage at [email or web address], password [facility] 11/28/2012, 7:37 AM  LOS: 2 days

## 2012-11-28 NOTE — ED Provider Notes (Signed)
Medical screening examination/treatment/procedure(s) were conducted as a shared visit with non-physician practitioner(s) and myself.  I personally evaluated the patient during the encounter Pt approx 1 week s/p uncomplicated c section.  Notes gradual onset fever, cough, mild sob, in past few days. No pleuritic pain. No leg pain or swelling. cxr cw pna. Also anemic, similar to that noted at d/c from [facility], [place] witness, states vaginal bleeding lessened since c section but not resolved. abd soft nt. Denies abd or pelvic pain. Mild frontal headache, dull, similar to prior. No neck stiffness or rigidity.   Suzi Roots, MD 11/28/12 [phone]

## 2012-11-29 DIAGNOSIS — N390 Urinary tract infection, site not specified: Secondary | ICD-10-CM

## 2012-11-29 LAB — GLUCOSE, CAPILLARY: Glucose-Capillary: 98 mg/dL (ref 70–99)

## 2012-11-29 LAB — CBC
HCT: 28 % — ABNORMAL LOW (ref 36.0–46.0)
Hemoglobin: 8.9 g/dL — ABNORMAL LOW (ref 12.0–15.0)
WBC: 14.2 10*3/uL — ABNORMAL HIGH (ref 4.0–10.5)

## 2012-11-29 MED ORDER — IBUPROFEN 400 MG PO TABS
400.0000 mg | ORAL_TABLET | Freq: Once | ORAL | Status: AC
  Administered 2012-11-29: 400 mg via ORAL
  Filled 2012-11-29: qty 1

## 2012-11-29 NOTE — Progress Notes (Signed)
ANTIBIOTIC CONSULT NOTE - Follow Up  Pharmacy Consult for Vancomycin, Cefepime Indication: HCAP, UTI  Allergies  Allergen Reactions  . Other     No blood products. Pt is of Jehovah Witness Faith.    Patient Measurements: Height: 5\' 5"  (165.1 cm) Weight: 255 lb 11.7 oz (116 kg) IBW/kg (Calculated) : 57    Vital Signs: Temp: 98.8 F (37.1 C) (06/29 0600) Temp src: Oral (06/29 0600) BP: 138/74 mmHg (06/29 0600) Pulse Rate: 59 (06/29 0600)  Labs:  Recent Labs  11/26/12 1545 11/28/12 0350 11/29/12 0436  WBC 14.2* 15.0* 14.2*  HGB 5.9* 8.5* 8.9*  PLT 402* 415* 447*  CREATININE 0.69 0.76  --    Estimated Creatinine Clearance: 142.7 ml/min (by C-G formula based on Cr of 0.76).    Microbiology: Recent Results (from the past 720 hour(s))  OB RESULTS CONSOLE GBS     Status: None   Collection Time    11/09/12 12:00 AM      Result Value Range Status   GBS Negative   Final  URINE CULTURE     Status: None   Collection Time    11/26/12  3:43 PM      Result Value Range Status   Specimen Description URINE, CLEAN CATCH   Final   Special Requests NONE   Final   Culture  Setup Time 11/26/2012 22:11   Final   Colony Count >=100,000 COLONIES/ML   Final   Culture PROTEUS MIRABILIS   Final   Report Status 11/28/2012 FINAL   Final   Organism ID, Bacteria PROTEUS MIRABILIS   Final  MRSA PCR SCREENING     Status: Abnormal   Collection Time    11/26/12  6:27 PM      Result Value Range Status   MRSA by PCR POSITIVE (*) NEGATIVE Final   Comment:            The GeneXpert MRSA Assay (FDA     approved for NASAL specimens     only), is one component of a     comprehensive MRSA colonization     surveillance program. It is not     intended to diagnose MRSA     infection nor to guide or     monitor treatment for     MRSA infections.     RESULT CALLED TO, READ BACK BY AND VERIFIED WITH:     STERN,R. AT 2028 ON 06.26.14 BY LOVE,T.    Medical History: Past Medical History   Diagnosis Date  . Hx of varicella   . 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  She is s/p C-section 6/19, per report no longer breastfeeding. Today is Day#4 Vanco 1g q12h plus cefepime 1g q8h. Vanco is off schedule today, not going to check trough at this point. Also, plan to change to Levaquin noted. Pt has proteus UTI, sens to all but nitro.  Goal of Therapy:  Vancomycin trough level 15-20 mcg/ml  Plan:   Continue current Vanco and cefepime doses  Hopefully can narrow abx soon.   Darrol Angel, PharmD Pager: [phone]  11/29/2012,1:06 PM

## 2012-11-29 NOTE — Progress Notes (Signed)
[facility] PROGRESS NOTE  Nicole Woods [name]:[id number] DOB: January 04, 1991 DOA: 11/26/2012 PCP: Default, Provider, MD  Assessment/Plan: 1. Health Care Associated PNA: Continue with vancomycin and cefepime day 4. Sputum culture never collected. Strep and legionella antigen negative. Will plan to discharge patient on Levaquin.  2. Anemia: likely iron deficiency in setting of recent pregnancy, C section. Patient received 2 units of PRBC 6-26. Continue with iron supplement. Hb stable.  3. UTI: UA with 21 to 50 WBC. Cefepime should cover for UTI.  urine culture: proteus mirabilis.   4. Recent C section, Patient has decide not to  breast feeding.  5. Headache: Almost resolved. Normal CT of the head without contrast. 6. DVT Prophylaxis: Patient at risk for DVT due to recent Sx, pregnancy. She only relates very small amount of vaginal bleed. She agree to anticoagulation, and she understand that Hb can decrease and she could require more transfusion. Hb stable on heparin.   Code Status: Full Code.  Family Communication: Care discussed with patient and fiance.  Disposition Plan: Plan home 6-30.    Consultants:  none  Procedures:  none  Antibiotics:  Vancomycin 6-26  Cefepime 6-26  HPI/Subjective: Feeling better, breathing better.  Didn't know she had UTI.    Objective: Filed Vitals:   11/28/12 1100 11/28/12 1155 11/28/12 2312 11/29/12 0600  BP:  136/80 143/97 138/74  Pulse:  63 85 59  Temp:  98.4 F (36.9 C) 98.8 F (37.1 C) 98.8 F (37.1 C)  TempSrc:  Oral Oral Oral  Resp: 20 18 20 16   Height:  5\' 5"  (1.651 m)    Weight:      SpO2:  99% 100% 95%    Intake/Output Summary (Last 24 hours) at 11/29/12 1238 Last data filed at 11/29/12 0700  Gross per 24 hour  Intake   1940 ml  Output    600 ml  Net   1340 ml   Filed Weights   11/26/12 2100  Weight: 116 kg (255 lb 11.7 oz)    Exam:   General:  No distress.   Cardiovascular: S 1, S 2 RRR  Respiratory:  CTA  Abdomen: BS present, soft, NT, incision from C section healing.   Musculoskeletal: no edema.   Data Reviewed: Basic Metabolic Panel:  Recent Labs Lab 11/26/12 1545 11/28/12 0350  NA 141 138  K 3.8 4.1  CL 110 107  CO2 19 22  GLUCOSE 72 89  BUN 9 9  CREATININE 0.69 0.76  CALCIUM 8.0* 9.0   : CBC:  Recent Labs Lab 11/26/12 1545 11/28/12 0350 11/29/12 0436  WBC 14.2* 15.0* 14.2*  NEUTROABS 11.5*  --   --   HGB 5.9* 8.5* 8.9*  HCT 18.9* 27.1* 28.0*  MCV 82.9 83.6 83.8  PLT 402* 415* 447*   Cardiac Enzymes: No results found for this basename: CKTOTAL, CKMB, CKMBINDEX, TROPONINI,  in the last 168 hours BNP (last 3 results) No results found for this basename: PROBNP,  in the last 8760 hours CBG: No results found for this basename: GLUCAP,  in the last 168 hours  Recent Results (from the past 240 hour(s))  URINE CULTURE     Status: None   Collection Time    11/26/12  3:43 PM      Result Value Range Status   Specimen Description URINE, CLEAN CATCH   Final   Special Requests NONE   Final   Culture  Setup Time 11/26/2012 22:11   Final   Colony Count >=100,000 COLONIES/ML  Final   Culture PROTEUS MIRABILIS   Final   Report Status 11/28/2012 FINAL   Final   Organism ID, Bacteria PROTEUS MIRABILIS   Final  MRSA PCR SCREENING     Status: Abnormal   Collection Time    11/26/12  6:27 PM      Result Value Range Status   MRSA by PCR POSITIVE (*) NEGATIVE Final   Comment:            The GeneXpert MRSA Assay (FDA     approved for NASAL specimens     only), is one component of a     comprehensive MRSA colonization     surveillance program. It is not     intended to diagnose MRSA     infection nor to guide or     monitor treatment for     MRSA infections.     RESULT CALLED TO, READ BACK BY AND VERIFIED WITH:     STERN,R. AT 2028 ON 06.26.14 BY LOVE,T.     Studies: No results found.  Scheduled Meds: . antiseptic oral rinse  15 mL Mouth Rinse BID  .  ceFEPime (MAXIPIME) IV  1 g Intravenous Q8H  . Chlorhexidine Gluconate Cloth  6 each Topical Q0600  . ferrous sulfate  325 mg Oral TID PC & HS  . heparin subcutaneous  5,000 Units Subcutaneous Q8H  . multivitamin with minerals  1 tablet Oral Daily  . mupirocin ointment  1 application Nasal BID  . senna-docusate  1 tablet Oral BID  . vancomycin  1,000 mg Intravenous Q12H   Continuous Infusions:   Principal Problem:   Anemia Active Problems:   Healthcare-associated pneumonia   Refusal of blood transfusions as patient is Jehovah's Witness    Time spent: 25 minutes.     Woods,Nicole  [facility] Pager [phone]. If 7PM-7AM, please contact night-coverage at [email or web address], password [facility] 11/29/2012, 12:38 PM  LOS: 3 days

## 2012-11-30 MED ORDER — ADULT MULTIVITAMIN W/MINERALS CH: 1.0000 | ORAL_TABLET | Freq: Every day | ORAL | Status: DC

## 2012-11-30 MED ORDER — LEVOFLOXACIN 750 MG PO TABS: 750.0000 mg | ORAL_TABLET | Freq: Every day | ORAL | Status: DC

## 2012-11-30 MED ORDER — FERROUS SULFATE 325 (65 FE) MG PO TABS: 325.0000 mg | ORAL_TABLET | Freq: Three times a day (TID) | ORAL | Status: DC

## 2012-11-30 NOTE — Discharge Summary (Signed)
Physician Discharge Summary  Nicole Woods [name]:[id number] DOB: 01-Dec-1990 DOA: 11/26/2012  PCP: Default, Provider, MD  Admit date: 11/26/2012 Discharge date: 11/30/2012  Time spent: 35 minutes  Recommendations for Outpatient Follow-up:  1. Follow up with [facility] for further care. Need repeat hb.   Discharge Diagnoses:    Anemia   Healthcare-associated pneumonia   Refusal of blood transfusions as patient is Jehovah's Witness   UTI (urinary tract infection)   Discharge Condition: Improved.   Diet recommendation: Regular diet.   Filed Weights   11/26/12 2100  Weight: 116 kg (255 lb 11.7 oz)    History of present illness:  Nicole Woods is a 22 y.o. female  That presented to the ED complaining of headache and cough. The headache has been present for the last day. Nothing she is aware of makes it better. She started coughing 2 days ago and noticed it more at night. Not associated with hemoptysis. The cough has been non productive. The patient denies feeling short of breath. She reportedly had C section and delivered one child ~ 19th. She is Jehovah's Witness and refuses all blood products.  In the Ed pt was found to have a pneumonia diagnosed with chest x ray. Also was found to have a hgb of 5.9   Hospital Course:  Health Care Associated PNA: Patient received vancomycin and cefepime for 5 days.Marland Kitchen Sputum culture never collected. Strep and legionella antigen negative. Will  discharge patient on Levaquin for 5 more days. Advised patient not to breast feed while taking Levaquin. She will resume breastfeeding one and half day after she finished with antibiotics. .  Anemia: likely iron deficiency in setting of recent pregnancy, C section. Patient received 2 units of PRBC 6-26. Continue with iron supplement. Hb stable.  UTI: UA with 21 to 50 WBC. Cefepime should cover for UTI. urine culture: proteus mirabilis sensitive to Levaquin. Recent C section, Patient has decide not to breast feeding. Advised not  to take ibuprofen if she is going to breastfeeding.  Headache:  resolved. Normal CT of the head without contrast. DVT Prophylaxis: Patient at risk for DVT due to recent Sx, pregnancy. She only relates very small amount of vaginal bleed. She agree to anticoagulation, and she understand that Hb can decrease and she could require more transfusion. Hb stable on heparin.    Procedures:  none  Consultations:  none  Discharge Exam: Filed Vitals:   11/29/12 0600 11/29/12 1434 11/29/12 2203 11/30/12 0545  BP: 138/74 130/75 139/84 134/88  Pulse: 59 67 85 64  Temp: 98.8 F (37.1 C) 98.6 F (37 C) 98.7 F (37.1 C) 98.5 F (36.9 C)  TempSrc: Oral Oral Oral Oral  Resp: 16 18 20 20   Height:      Weight:      SpO2: 95% 99% 100% 97%    General: No distress.  Cardiovascular: S 1, S 2 RRR Respiratory: CTA  Discharge Instructions  Discharge Orders   Future Orders Complete By Expires     Diet general  As directed     Increase activity slowly  As directed         Medication List    STOP taking these medications       ibuprofen 800 MG tablet  Commonly known as:  ADVIL,MOTRIN     oxyCODONE-acetaminophen 5-325 MG per tablet  Commonly known as:  PERCOCET/ROXICET      TAKE these medications       acetaminophen 500 MG tablet  Commonly known as:  TYLENOL  Take 1,000 mg by mouth every 6 (six) hours as needed for pain.     ferrous sulfate 325 (65 FE) MG tablet  Take 1 tablet (325 mg total) by mouth 3 (three) times daily with meals.     levofloxacin 750 MG tablet  Commonly known as:  LEVAQUIN  Take 1 tablet (750 mg total) by mouth daily.     multivitamin with minerals Tabs  Take 1 tablet by mouth daily.       Allergies  Allergen Reactions  . Other     No blood products. Pt is of Jehovah Witness Faith.       Follow-up Information   Follow up with Default, Provider, MD. (Follow with PCP in 1 week)    Contact information:   [address] [place]  [address] [phone]        The results of significant diagnostics from this hospitalization (including imaging, microbiology, ancillary and laboratory) are listed below for reference.    Significant Diagnostic Studies: Dg Chest 2 View  11/26/2012   *RADIOLOGY REPORT*  Clinical Data: Fever and shortness of breath.  CHEST - 2 VIEW  Comparison: None.  Findings: There is a faint right perihilar infiltrate extending into the right lower lobe.  Left lung is clear.  Heart size and vascularity are normal.  No acute osseous abnormality.  No effusions.  IMPRESSION: Findings consistent with a right perihilar right lower lobe infiltrate.  The markings may be at least in part accentuated by the shallow inspiration.   Original Report Authenticated By: Francene Boyers, M.D.    Microbiology: Recent Results (from the past 240 hour(s))  URINE CULTURE     Status: None   Collection Time    11/26/12  3:43 PM      Result Value Range Status   Specimen Description URINE, CLEAN CATCH   Final   Special Requests NONE   Final   Culture  Setup Time 11/26/2012 22:11   Final   Colony Count >=100,000 COLONIES/ML   Final   Culture PROTEUS MIRABILIS   Final   Report Status 11/28/2012 FINAL   Final   Organism ID, Bacteria PROTEUS MIRABILIS   Final  MRSA PCR SCREENING     Status: Abnormal   Collection Time    11/26/12  6:27 PM      Result Value Range Status   MRSA by PCR POSITIVE (*) NEGATIVE Final   Comment:            The GeneXpert MRSA Assay (FDA     approved for NASAL specimens     only), is one component of a     comprehensive MRSA colonization     surveillance program. It is not     intended to diagnose MRSA     infection nor to guide or     monitor treatment for     MRSA infections.     RESULT CALLED TO, READ BACK BY AND VERIFIED WITH:     STERN,R. AT 2028 ON 06.26.14 BY LOVE,T.     Labs: Basic Metabolic Panel:  Recent Labs Lab 11/26/12 1545 11/28/12 0350  NA 141 138  K 3.8 4.1  CL 110 107   CO2 19 22  GLUCOSE 72 89  BUN 9 9  CREATININE 0.69 0.76  CALCIUM 8.0* 9.0   Liver Function Tests: No results found for this basename: AST, ALT, ALKPHOS, BILITOT, PROT, ALBUMIN,  in the last 168 hours No results found for this  basename: LIPASE, AMYLASE,  in the last 168 hours No results found for this basename: AMMONIA,  in the last 168 hours CBC:  Recent Labs Lab 11/26/12 1545 11/28/12 0350 11/29/12 0436  WBC 14.2* 15.0* 14.2*  NEUTROABS 11.5*  --   --   HGB 5.9* 8.5* 8.9*  HCT 18.9* 27.1* 28.0*  MCV 82.9 83.6 83.8  PLT 402* 415* 447*   Cardiac Enzymes: No results found for this basename: CKTOTAL, CKMB, CKMBINDEX, TROPONINI,  in the last 168 hours BNP: BNP (last 3 results) No results found for this basename: PROBNP,  in the last 8760 hours CBG:  Recent Labs Lab 11/29/12 2201  GLUCAP 98       Signed:  [name],[name]  [facility] 11/30/2012, 8:29 AM

## 2012-11-30 NOTE — Care Management Note (Addendum)
    Page 1 of 2   11/30/2012     12:31:24 PM   CARE MANAGEMENT NOTE 11/30/2012  Patient:  [name]   Account Number:  [id number]  Date Initiated:  11/27/2012  Documentation initiated by:  DAVIS,RHONDA  Subjective/Objective Assessment:   pt with recent history of fetal delivery,hgb 5.6, pna by cxr, dypnea, fever.     Action/Plan:   PATIENT IS A JEOVAH WITNESS/but has consented to transfusions.   Anticipated DC Date:  11/30/2012   Anticipated DC Plan:  HOME/SELF CARE  In-house referral  NA      DC Planning Services  CM consult      PAC Choice  NA   Choice offered to / List presented to:  NA   DME arranged  NA      DME agency  NA     HH arranged  NA      HH agency  NA   Status of service:  Completed, signed off Medicare Important Message given?  NA - LOS <3 / Initial given by admissions (If response is "NO", the following Medicare IM given date fields will be blank) Date Medicare IM given:   Date Additional Medicare IM given:    Discharge Disposition:  HOME/SELF CARE  Per UR Regulation:  Reviewed for med. necessity/level of care/duration of stay  If discussed at Long Length of Stay Meetings, dates discussed:    Comments:  11/30/12 Nicole Caldeira RN,BSN NCM [phone] D/C HOME NO ORDERS OR NEEDS.  [id number] Nicole Plater, RN, BSN, CCM:  CHART REVIEWED AND UPDATED.  Next chart review due on [id number]. NO DISCHARGE NEEDS PRESENT AT THIS TIME. CASE MANAGEMENT [phone]

## 2013-01-24 ENCOUNTER — Emergency Department (HOSPITAL_COMMUNITY)
Admission: EM | Admit: 2013-01-24 | Discharge: 2013-01-24 | Disposition: A | Discharge status: Home / Self Care | Payer: 59 | Source: Home / Self Care | Attending: Emergency Medicine | Admitting: Emergency Medicine

## 2013-01-24 ENCOUNTER — Encounter (HOSPITAL_COMMUNITY): Payer: Self-pay | Admitting: *Deleted

## 2013-01-24 DIAGNOSIS — IMO0002 Reserved for concepts with insufficient information to code with codable children: Secondary | ICD-10-CM

## 2013-01-24 DIAGNOSIS — L03012 Cellulitis of left finger: Secondary | ICD-10-CM

## 2013-01-24 MED ORDER — HYDROCODONE-ACETAMINOPHEN 5-325 MG PO TABS: ORAL_TABLET | ORAL | Status: DC

## 2013-01-24 MED ORDER — CEPHALEXIN 500 MG PO CAPS: 500.0000 mg | ORAL_CAPSULE | Freq: Three times a day (TID) | ORAL | Status: DC

## 2013-01-24 MED ORDER — MUPIROCIN 2 % EX OINT: TOPICAL_OINTMENT | Freq: Three times a day (TID) | CUTANEOUS | Status: DC

## 2013-01-24 NOTE — ED Notes (Signed)
Started with left great toe pain 2 days ago; medial aspect of nailbed swollen, red, paronychia noted.  Yesterday also started with left second toe paronychia & slight tenderness.  When cutting toenails today also noticed drainage from right 3rd toe without any tenderness.  Denies any fevers.  Pt is breastfeeding.

## 2013-01-24 NOTE — ED Notes (Signed)
Paronychias being incised per Dr. Lorenz Coaster.

## 2013-01-24 NOTE — ED Provider Notes (Signed)
Chief Complaint:   Chief Complaint  Patient presents with  . Ingrown Toenail    History of Present Illness:   Nicole Woods is a 22 year old female who has infected areas on her left great toe, right third toe, and left second toe. She has collections of pus under the nail fold, tenderness to palpation, and swelling in the infection in the right third toe it has healed up on its own. She denies any trauma to these areas.  Review of Systems:  Other than noted above, the patient denies any of the following symptoms: Systemic:  No fevers, chills, or sweats.  No fatigue or tiredness. Musculoskeletal:  No joint pain, arthritis, bursitis, swelling, or back pain.  Neurological:  No muscular weakness, paresthesias.  PMFSH:  Past medical history, family history, social history, meds, and allergies were reviewed.  No history of gout.    Physical Exam:   Vital signs:  BP 133/91  Pulse 74  Temp(Src) 98.4 F (36.9 C) (Oral)  Resp 18  SpO2 98%  Breastfeeding? Yes Gen:  Alert and oriented times 3.  In no distress. Musculoskeletal:  Exam of the foot reveals she has paronychia is involving the left great toe and left second toe. Medial nail folds of both of these were involved. There is no actual evidence of an ingrown toenail. The right third toe appears to be healed up on its.  Otherwise, all joints had a full a ROM with no swelling, bruising or deformity.  No edema, pulses full. Extremities were warm and pink.  Capillary refill was brisk.  Skin:  Clear, warm and dry.  No rash. Neuro:  Alert and oriented times 3.  Muscle strength was normal.  Sensation was intact to light touch.   Procedure Note:  Verbal informed consent was obtained from the patient.  Risks and benefits were outlined with the patient.  Patient understands and accepts these risks.  Identity of the patient was confirmed verbally and by armband.    Procedure was performed as follows:  The left great toe and left second toe were prepped  with alcohol and incision was made into the collection of pus. A drop of pus was drained from each one was cultured and antibiotic ointment and sterile dressings were applied.  Patient tolerated the procedure well without any immediate complications.  Assessment:  The encounter diagnosis was Paronychia, left.  She has paronychias involving several toes simultaneously. I'm not sure why this is. There is no evidence of ingrown toenail.  Plan:   1.  The following meds were prescribed:   Discharge Medication List as of 01/24/2013 11:19 AM    START taking these medications   Details  cephALEXin (KEFLEX) 500 MG capsule Take 1 capsule (500 mg total) by mouth 3 (three) times daily., Starting 01/24/2013, Until Discontinued, Normal    HYDROcodone-acetaminophen (NORCO/VICODIN) 5-325 MG per tablet 1 to 2 tabs every 4 to 6 hours as needed for pain., Print    mupirocin ointment (BACTROBAN) 2 % Apply topically 3 (three) times daily., Starting 01/24/2013, Until Discontinued, Normal       2.  The patient was instructed in symptomatic care, including rest and activity, elevation, and application of heat.  Appropriate handouts were given. 3.  The patient was told to return if becoming worse in any way, if no better in 3 or 4 days, and given some red flag symptoms such as worsening pain or fever that would indicate earlier return.   4.  The patient was told  to follow up here if necessary.      Reuben Likes, MD 01/24/13 2055

## 2013-01-26 LAB — CULTURE, ROUTINE-ABSCESS

## 2013-01-28 ENCOUNTER — Telehealth (HOSPITAL_COMMUNITY): Payer: Self-pay | Admitting: *Deleted

## 2013-01-28 NOTE — ED Notes (Addendum)
Abscess culture toe: Mod. MRSA.  Pt. treated with I and D,  Keflex and Bactroban.  8/26 Message sent to Dr. Lorenz Coaster.  8/28 I called pt. and left a message to call, to ask about clinical improvement. Nicole Woods 01/28/2013 Pt. Called back.  Pt. verified x 2 and given results. Pt. States she had MRSA before 2 yrs ago.  She said no drainage, redness or pain.  I told pt. I would tell Dr. Lorenz Coaster and I would call her back if any change. I reviewed the [facility] MRSA instructions with her.  Pt. instructed to come back if it worsens in any way. Pt. voiced understanding.

## 2013-01-31 ENCOUNTER — Telehealth (HOSPITAL_COMMUNITY): Payer: Self-pay | Admitting: *Deleted

## 2013-01-31 NOTE — ED Notes (Signed)
Discussed with Dr. Lorenz Coaster and told him I had checked for clinical improvement on 8/28 and what pt. said.  He said he was not aware that pt. has had MRSA in the past, and to tell pt. to come back if it gets worse.  I called pt. and gave her this information. I told her she needs antibiotics each time she has an outbreak. Pt. voiced understanding. Nicole Woods 01/31/2013

## 2013-03-12 ENCOUNTER — Emergency Department (INDEPENDENT_AMBULATORY_CARE_PROVIDER_SITE_OTHER)
Admission: EM | Admit: 2013-03-12 | Discharge: 2013-03-12 | Disposition: A | Discharge status: Home / Self Care | Payer: 59 | Source: Home / Self Care | Attending: Family Medicine | Admitting: Family Medicine

## 2013-03-12 ENCOUNTER — Encounter (HOSPITAL_COMMUNITY): Payer: Self-pay | Admitting: Emergency Medicine

## 2013-03-12 DIAGNOSIS — L03012 Cellulitis of left finger: Secondary | ICD-10-CM

## 2013-03-12 DIAGNOSIS — IMO0002 Reserved for concepts with insufficient information to code with codable children: Secondary | ICD-10-CM

## 2013-03-12 NOTE — ED Provider Notes (Signed)
CSN: [id number]     Arrival date & time 03/12/13  1153 History   None    Chief Complaint  Patient presents with  . Toe Pain    left great toe infection   (Consider location/radiation/quality/duration/timing/severity/associated sxs/prior Treatment) HPI Comments: 22 year old female presents complaining of left great toe infection. She was seen here back on August 24 for the same indication. She had incision and drainage of paronychia. The subsequent culture revealed MRSA. She was okay until 3 days ago when she started to have some pain and swelling in the medial left great nail fold. She had increasing pain and redness until she starts and she squeezed it. There is a fair amount of purulent drainage. Now the pain is resolving. She continues to soak this multiple times daily. She is here because she was told to return if the infection returns. He does not feel sick at this time. She denies fever, chills, NVD, or any infections elsewhere.  Patient is a 22 y.o. female presenting with toe pain.  Toe Pain Pertinent negatives include no chest pain, no abdominal pain and no shortness of breath.    Past Medical History  Diagnosis Date  . Hx of varicella   . 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 Cesarean section N/A 11/19/2012    Procedure: CESAREAN SECTION;  Surgeon: Meriel Pica, MD;  Location: [facility];  Service: Obstetrics;  Laterality: N/A;  . Cesarean section     Family History  Problem Relation Age of Onset  . Asthma Neg Hx   . Birth defects Neg Hx   . Cancer Neg Hx   . Depression Neg Hx   . Diabetes Neg Hx   . Hearing loss Neg Hx   . Heart disease Neg Hx   . Hyperlipidemia Neg Hx   . Hypertension Neg Hx   . Kidney disease Neg Hx   . Miscarriages / Stillbirths Neg Hx   . Stroke Neg Hx   . Arthritis Neg Hx   . Alcohol abuse Neg Hx   . COPD Neg Hx   . Drug abuse  Neg Hx   . Early death Neg Hx   . Learning disabilities Neg Hx   . Mental illness Neg Hx   . Mental retardation Neg Hx   . Vision loss Neg Hx    History  Substance Use Topics  . Smoking status: Never Smoker   . Smokeless tobacco: Never Used  . Alcohol Use: No   OB History   Grav Para Term Preterm Abortions TAB SAB Ect Mult Living   1 1 1       1      Review of Systems  Constitutional: Negative for fever and chills.  Eyes: Negative for visual disturbance.  Respiratory: Negative for cough and shortness of breath.   Cardiovascular: Negative for chest pain, palpitations and leg swelling.  Gastrointestinal: Negative for nausea, vomiting and abdominal pain.  Endocrine: Negative for polydipsia and polyuria.  Genitourinary: Negative for dysuria, urgency and frequency.  Musculoskeletal: Negative for arthralgias and myalgias.  Skin: Positive for wound (see history of present illness). Negative for rash.  Neurological: Negative for dizziness, weakness and light-headedness.    Allergies  Other  Home Medications   Current Outpatient Rx  Name  Route  Sig  Dispense  Refill  . acetaminophen (TYLENOL) 500  MG tablet   Oral   Take 1,000 mg by mouth every 6 (six) hours as needed for pain.         . cephALEXin (KEFLEX) 500 MG capsule   Oral   Take 1 capsule (500 mg total) by mouth 3 (three) times daily.   30 capsule   0   . ferrous sulfate 325 (65 FE) MG tablet   Oral   Take 1 tablet (325 mg total) by mouth 3 (three) times daily with meals.   60 tablet   0   . HYDROcodone-acetaminophen (NORCO/VICODIN) 5-325 MG per tablet      1 to 2 tabs every 4 to 6 hours as needed for pain.   20 tablet   0   . levofloxacin (LEVAQUIN) 750 MG tablet   Oral   Take 1 tablet (750 mg total) by mouth daily.   5 tablet   0   . Multiple Vitamin (MULTIVITAMIN WITH MINERALS) TABS   Oral   Take 1 tablet by mouth daily.   30 tablet   0   . mupirocin ointment (BACTROBAN) 2 %   Topical    Apply topically 3 (three) times daily.   22 g   0    BP 117/69  Pulse 87  Temp(Src) 97.9 F (36.6 C) (Oral)  Resp 16  SpO2 100%  LMP 03/03/2013  Breastfeeding? No Physical Exam  Nursing note and vitals reviewed. Constitutional: She is oriented to person, place, and time. Vital signs are normal. She appears well-developed and well-nourished. No distress.  HENT:  Head: Normocephalic and atraumatic.  Pulmonary/Chest: Effort normal. No respiratory distress.  Musculoskeletal:       Feet:  Neurological: She is alert and oriented to person, place, and time. She has normal strength. Coordination normal.  Skin: Skin is warm and dry. No rash noted. She is not diaphoretic.  Psychiatric: She has a normal mood and affect. Judgment normal.    ED Course  Procedures (including critical care time) Labs Review Labs Reviewed - No data to display Imaging Review No results found.    MDM   1. Paronychia, left    Continue warm soaks at least 3 times daily. Keep clean and dry I have advised chlorhexidine washes to prevent recurrent skin infections because this seems to be an issue for her. She has a history of multiple recurrent abscesses    Graylon Good, PA-C 03/12/13 1314

## 2013-03-12 NOTE — ED Notes (Signed)
C/o infection in great left toe. Pt was seen 8/24 for same thing. Culture came back showing mrsa. Pt was told to return if infection came back.    States some drainage. Denies pain. Pt has been soaking foot in epsom salt and using ointment.

## 2013-03-16 NOTE — ED Provider Notes (Signed)
Medical screening examination/treatment/procedure(s) were performed by a resident physician or non-physician practitioner and as the supervising physician I was immediately available for consultation/collaboration.  Solmon Bohr, MD    Korbin Mapps S Shantai Tiedeman, MD 03/16/13 0747 

## 2013-04-08 ENCOUNTER — Other Ambulatory Visit: Payer: Self-pay

## 2013-06-16 ENCOUNTER — Emergency Department (HOSPITAL_COMMUNITY)
Admission: EM | Admit: 2013-06-16 | Discharge: 2013-06-16 | Disposition: A | Discharge status: Home / Self Care | Payer: 59 | Source: Home / Self Care

## 2013-06-16 ENCOUNTER — Encounter (HOSPITAL_COMMUNITY): Payer: Self-pay | Admitting: Emergency Medicine

## 2013-06-16 DIAGNOSIS — J019 Acute sinusitis, unspecified: Secondary | ICD-10-CM

## 2013-06-16 DIAGNOSIS — J069 Acute upper respiratory infection, unspecified: Secondary | ICD-10-CM

## 2013-06-16 NOTE — ED Notes (Signed)
C/o cold sx States she has a productive green mucous cough, sneezing, headache, light headed, itchy throat, and runny nose Tylenol, ibuprofen, and sudafed was used as tx

## 2013-06-16 NOTE — Discharge Instructions (Signed)
Upper Respiratory Infection, Adult Saline Nasal Spray Continue your medication Ibuprofen 600mg  every 6 hours An upper respiratory infection (URI) is also sometimes known as the common cold. The upper respiratory tract includes the nose, sinuses, throat, trachea, and bronchi. Bronchi are the airways leading to the lungs. Most people improve within 1 week, but symptoms can last up to 2 weeks. A residual cough may last even longer.  CAUSES Many different viruses can infect the tissues lining the upper respiratory tract. The tissues become irritated and inflamed and often become very moist. Mucus production is also common. A cold is contagious. You can easily spread the virus to others by oral contact. This includes kissing, sharing a glass, coughing, or sneezing. Touching your mouth or nose and then touching a surface, which is then touched by another person, can also spread the virus. SYMPTOMS  Symptoms typically develop 1 to 3 days after you come in contact with a cold virus. Symptoms vary from person to person. They may include:  Runny nose.  Sneezing.  Nasal congestion.  Sinus irritation.  Sore throat.  Loss of voice (laryngitis).  Cough.  Fatigue.  Muscle aches.  Loss of appetite.  Headache.  Low-grade fever. DIAGNOSIS  You might diagnose your own cold based on familiar symptoms, since most people get a cold 2 to 3 times a year. Your caregiver can confirm this based on your exam. Most importantly, your caregiver can check that your symptoms are not due to another disease such as strep throat, sinusitis, pneumonia, asthma, or epiglottitis. Blood tests, throat tests, and X-rays are not necessary to diagnose a common cold, but they may sometimes be helpful in excluding other more serious diseases. Your caregiver will decide if any further tests are required. RISKS AND COMPLICATIONS  You may be at risk for a more severe case of the common cold if you smoke cigarettes, have chronic  heart disease (such as heart failure) or lung disease (such as asthma), or if you have a weakened immune system. The very young and very old are also at risk for more serious infections. Bacterial sinusitis, middle ear infections, and bacterial pneumonia can complicate the common cold. The common cold can worsen asthma and chronic obstructive pulmonary disease (COPD). Sometimes, these complications can require emergency medical care and may be life-threatening. PREVENTION  The best way to protect against getting a cold is to practice good hygiene. Avoid oral or hand contact with people with cold symptoms. Wash your hands often if contact occurs. There is no clear evidence that vitamin C, vitamin E, echinacea, or exercise reduces the chance of developing a cold. However, it is always recommended to get plenty of rest and practice good nutrition. TREATMENT  Treatment is directed at relieving symptoms. There is no cure. Antibiotics are not effective, because the infection is caused by a virus, not by bacteria. Treatment may include:  Increased fluid intake. Sports drinks offer valuable electrolytes, sugars, and fluids.  Breathing heated mist or steam (vaporizer or shower).  Eating chicken soup or other clear broths, and maintaining good nutrition.  Getting plenty of rest.  Using gargles or lozenges for comfort.  Controlling fevers with ibuprofen or acetaminophen as directed by your caregiver.  Increasing usage of your inhaler if you have asthma. Zinc gel and zinc lozenges, taken in the first 24 hours of the common cold, can shorten the duration and lessen the severity of symptoms. Pain medicines may help with fever, muscle aches, and throat pain. A variety of non-prescription  medicines are available to treat congestion and runny nose. Your caregiver can make recommendations and may suggest nasal or lung inhalers for other symptoms.  HOME CARE INSTRUCTIONS   Only take over-the-counter or  prescription medicines for pain, discomfort, or fever as directed by your caregiver.  Use a warm mist humidifier or inhale steam from a shower to increase air moisture. This may keep secretions moist and make it easier to breathe.  Drink enough water and fluids to keep your urine clear or pale yellow.  Rest as needed.  Return to work when your temperature has returned to normal or as your caregiver advises. You may need to stay home longer to avoid infecting others. You can also use a face mask and careful hand washing to prevent spread of the virus. SEEK MEDICAL CARE IF:   After the first few days, you feel you are getting worse rather than better.  You need your caregiver's advice about medicines to control symptoms.  You develop chills, worsening shortness of breath, or brown or red sputum. These may be signs of pneumonia.  You develop yellow or brown nasal discharge or pain in the face, especially when you bend forward. These may be signs of sinusitis.  You develop a fever, swollen neck glands, pain with swallowing, or white areas in the back of your throat. These may be signs of strep throat. SEEK IMMEDIATE MEDICAL CARE IF:   You have a fever.  You develop severe or persistent headache, ear pain, sinus pain, or chest pain.  You develop wheezing, a prolonged cough, cough up blood, or have a change in your usual mucus (if you have chronic lung disease).  You develop sore muscles or a stiff neck. Document Released: 11/13/2000 Document Revised: 08/12/2011 Document Reviewed: 09/21/2010 [facility] Patient Information 2014 [place], [place].  Sinusitis Sinusitis is redness, soreness, and puffiness (inflammation) of the air pockets in the bones of your face (sinuses). The redness, soreness, and puffiness can cause air and mucus to get trapped in your sinuses. This can allow germs to grow and cause an infection.  HOME CARE   Drink enough fluids to keep your pee (urine) clear or pale  yellow.  Use a humidifier in your home.  Run a hot shower to create steam in the bathroom. Sit in the bathroom with the door closed. Breathe in the steam 3 4 times a day.  Put a warm, moist washcloth on your face 3 4 times a day, or as told by your doctor.  Use salt water sprays (saline sprays) to wet the thick fluid in your nose. This can help the sinuses drain.  Only take medicine as told by your doctor. GET HELP RIGHT AWAY IF:   Your pain gets worse.  You have very bad headaches.  You are sick to your stomach (nauseous).  You throw up (vomit).  You are very sleepy (drowsy) all the time.  Your face is puffy (swollen).  Your vision changes.  You have a stiff neck.  You have trouble breathing. MAKE SURE YOU:   Understand these instructions.  Will watch your condition.  Will get help right away if you are not doing well or get worse. Document Released: 11/06/2007 Document Revised: 02/12/2012 Document Reviewed: 12/24/2011 [facility] IncExit[facility] Patient Information 2014 [place], [place].

## 2013-06-16 NOTE — ED Provider Notes (Signed)
CSN: [id number]     Arrival date & time 06/16/13  1354 History   First MD Initiated Contact with Patient 06/16/13 1425     Chief Complaint  Patient presents with  . URI   (Consider location/radiation/quality/duration/timing/severity/associated sxs/prior Treatment) HPI Comments: 23 year old female that developed sneezing and itchy throat last night. She took a Sudafed. This morning she awoke with headache, facial pain, sniffles, runny nose, nasal congestion, occasional cough. She purchased some Tylenol Cold and flu medication and has had one dose. That did not take away her symptoms.   Past Medical History  Diagnosis Date  . Hx of varicella   . 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 Cesarean section N/A 11/19/2012    Procedure: CESAREAN SECTION;  Surgeon: Meriel Pica, MD;  Location: [facility];  Service: Obstetrics;  Laterality: N/A;  . Cesarean section     Family History  Problem Relation Age of Onset  . Asthma Neg Hx   . Birth defects Neg Hx   . Cancer Neg Hx   . Depression Neg Hx   . Diabetes Neg Hx   . Hearing loss Neg Hx   . Heart disease Neg Hx   . Hyperlipidemia Neg Hx   . Hypertension Neg Hx   . Kidney disease Neg Hx   . Miscarriages / Stillbirths Neg Hx   . Stroke Neg Hx   . Arthritis Neg Hx   . Alcohol abuse Neg Hx   . COPD Neg Hx   . Drug abuse Neg Hx   . Early death Neg Hx   . Learning disabilities Neg Hx   . Mental illness Neg Hx   . Mental retardation Neg Hx   . Vision loss Neg Hx    History  Substance Use Topics  . Smoking status: Never Smoker   . Smokeless tobacco: Never Used  . Alcohol Use: No   OB History   Grav Para Term Preterm Abortions TAB SAB Ect Mult Living   1 1 1       1      Review of Systems  Constitutional: Positive for activity change. Negative for fever, chills, appetite change and fatigue.  HENT: Positive for  congestion, postnasal drip and rhinorrhea. Negative for facial swelling.   Eyes: Negative.   Respiratory: Positive for cough. Negative for wheezing.   Cardiovascular: Negative.   Gastrointestinal: Negative.   Genitourinary: Negative.   Musculoskeletal: Negative for neck pain and neck stiffness.  Skin: Negative for pallor and rash.  Neurological: Negative.     Allergies  Other  Home Medications   Current Outpatient Rx  Name  Route  Sig  Dispense  Refill  . acetaminophen (TYLENOL) 500 MG tablet   Oral   Take 1,000 mg by mouth every 6 (six) hours as needed for pain.         . cephALEXin (KEFLEX) 500 MG capsule   Oral   Take 1 capsule (500 mg total) by mouth 3 (three) times daily.   30 capsule   0   . ferrous sulfate 325 (65 FE) MG tablet   Oral   Take 1 tablet (325 mg total) by mouth 3 (three) times daily with meals.   60 tablet   0   . HYDROcodone-acetaminophen (NORCO/VICODIN) 5-325 MG per tablet      1 to 2 tabs  every 4 to 6 hours as needed for pain.   20 tablet   0   . levofloxacin (LEVAQUIN) 750 MG tablet   Oral   Take 1 tablet (750 mg total) by mouth daily.   5 tablet   0   . Multiple Vitamin (MULTIVITAMIN WITH MINERALS) TABS   Oral   Take 1 tablet by mouth daily.   30 tablet   0   . mupirocin ointment (BACTROBAN) 2 %   Topical   Apply topically 3 (three) times daily.   22 g   0    BP 115/78  Pulse 124  Temp(Src) 98.8 F (37.1 C) (Oral)  Resp 22  SpO2 98%  LMP 06/15/2013 Physical Exam  Nursing note and vitals reviewed. Constitutional: She is oriented to person, place, and time. She appears well-developed and well-nourished. No distress.  HENT:  Mouth/Throat: No oropharyngeal exudate.  Bilateral TMs are normal Oropharynx with minor erythema and clear PND.  Eyes: Conjunctivae and EOM are normal.  Neck: Normal range of motion. Neck supple.  Cardiovascular: Normal rate, regular rhythm and normal heart sounds.   Pulmonary/Chest: Effort normal  and breath sounds normal. No respiratory distress. She has no wheezes. She has no rales.  Musculoskeletal: Normal range of motion. She exhibits no edema.  Lymphadenopathy:    She has no cervical adenopathy.  Neurological: She is alert and oriented to person, place, and time.  Skin: Skin is warm and dry. No rash noted.  Psychiatric: She has a normal mood and affect.    ED Course  Procedures (including critical care time) Labs Review Labs Reviewed - No data to display Imaging Review No results found.    MDM   1. URI (upper respiratory infection)   2. Acute rhinosinusitis      Continue taking your Tylenol cold relief medication Drink plenty of fluids stay well hydrated Saline nasal spray frequently Rest Ibuprofen 600 mg every 6 hours when necessary    Hayden Rasmussenavid Xiadani Damman, NP 06/16/13 1525

## 2013-06-17 ENCOUNTER — Emergency Department (HOSPITAL_COMMUNITY)
Admission: EM | Admit: 2013-06-17 | Discharge: 2013-06-17 | Disposition: A | Discharge status: Home / Self Care | Payer: 59 | Attending: Emergency Medicine | Admitting: Emergency Medicine

## 2013-06-17 ENCOUNTER — Emergency Department (HOSPITAL_COMMUNITY): Payer: 59

## 2013-06-17 ENCOUNTER — Encounter (HOSPITAL_COMMUNITY): Payer: Self-pay | Admitting: Emergency Medicine

## 2013-06-17 DIAGNOSIS — Z8669 Personal history of other diseases of the nervous system and sense organs: Secondary | ICD-10-CM | POA: Insufficient documentation

## 2013-06-17 DIAGNOSIS — B9789 Other viral agents as the cause of diseases classified elsewhere: Secondary | ICD-10-CM | POA: Insufficient documentation

## 2013-06-17 DIAGNOSIS — B349 Viral infection, unspecified: Secondary | ICD-10-CM

## 2013-06-17 MED ORDER — TRIAMCINOLONE ACETONIDE 55 MCG/ACT NA AERO: 2.0000 | INHALATION_SPRAY | Freq: Every day | NASAL | Status: AC

## 2013-06-17 NOTE — Discharge Instructions (Signed)
Antibiotic Nonuse  Your caregiver felt that the infection or problem was not one that would be helped with an antibiotic. Infections may be caused by viruses or bacteria. Only a caregiver can tell which one of these is the likely cause of an illness. A cold is the most common cause of infection in both adults and children. A cold is a virus. Antibiotic treatment will have no effect on a viral infection. Viruses can lead to many lost days of work caring for sick children and many missed days of school. Children may catch as many as 10 "colds" or "flus" per year during which they can be tearful, cranky, and uncomfortable. The goal of treating a virus is aimed at keeping the ill person comfortable. Antibiotics are medications used to help the body fight bacterial infections. There are relatively few types of bacteria that cause infections but there are hundreds of viruses. While both viruses and bacteria cause infection they are very different types of germs. A viral infection will typically go away by itself within 7 to 10 days. Bacterial infections may spread or get worse without antibiotic treatment. Examples of bacterial infections are:  Sore throats (like strep throat or tonsillitis).  Infection in the lung (pneumonia).  Ear and skin infections. Examples of viral infections are:  Colds or flus.  Most coughs and bronchitis.  Sore throats not caused by Strep.  Runny noses. It is often best not to take an antibiotic when a viral infection is the cause of the problem. Antibiotics can kill off the helpful bacteria that we have inside our body and allow harmful bacteria to start growing. Antibiotics can cause side effects such as allergies, nausea, and diarrhea without helping to improve the symptoms of the viral infection. Additionally, repeated uses of antibiotics can cause bacteria inside of our body to become resistant. That resistance can be passed onto harmful bacterial. The next time you have  an infection it may be harder to treat if antibiotics are used when they are not needed. Not treating with antibiotics allows our own immune system to develop and take care of infections more efficiently. Also, antibiotics will work better for us when they are prescribed for bacterial infections. Treatments for a child that is ill may include:  Give extra fluids throughout the day to stay hydrated.  Get plenty of rest.  Only give your child over-the-counter or prescription medicines for pain, discomfort, or fever as directed by your caregiver.  The use of a cool mist humidifier may help stuffy noses.  Cold medications if suggested by your caregiver. Your caregiver may decide to start you on an antibiotic if:  The problem you were seen for today continues for a longer length of time than expected.  You develop a secondary bacterial infection. SEEK MEDICAL CARE IF:  Fever lasts longer than 5 days.  Symptoms continue to get worse after 5 to 7 days or become severe.  Difficulty in breathing develops.  Signs of dehydration develop (poor drinking, rare urinating, dark colored urine).  Changes in behavior or worsening tiredness (listlessness or lethargy). Document Released: 07/29/2001 Document Revised: 08/12/2011 Document Reviewed: 01/25/2009 [facility] Patient Information 2014 [place], [place]. Viral Infections A virus is a type of germ. Viruses can cause:  Minor sore throats.  Aches and pains.  Headaches.  Runny nose.  Rashes.  Watery eyes.  Tiredness.  Coughs.  Loss of appetite.  Feeling sick to your stomach (nausea).  Throwing up (vomiting).  Watery poop (diarrhea). HOME CARE  Only take medicines as told by your doctor.  Drink enough water and fluids to keep your pee (urine) clear or pale yellow. Sports drinks are a good choice.  Get plenty of rest and eat healthy. Soups and broths with crackers or rice are fine. GET HELP RIGHT AWAY IF:   You have a very  bad headache.  You have shortness of breath.  You have chest pain or neck pain.  You have an unusual rash.  You cannot stop throwing up.  You have watery poop that does not stop.  You cannot keep fluids down.  You or your child has a temperature by mouth above 102 F (38.9 C), not controlled by medicine.  Your baby is older than 3 months with a rectal temperature of 102 F (38.9 C) or higher.  Your baby is 213 months old or younger with a rectal temperature of 100.4 F (38 C) or higher. MAKE SURE YOU:   Understand these instructions.  Will watch this condition.  Will get help right away if you are not doing well or get worse. Document Released: 05/02/2008 Document Revised: 08/12/2011 Document Reviewed: 09/25/2010 [facility] Patient Information 2014 [place]Care, [place].

## 2013-06-17 NOTE — ED Notes (Signed)
Pt arrived to the Ed with a complaint of URI symptoms.  Pt states symptoms began on Tuesday of this week.  Pt went to Urgent Care where she was diagnosed with a URI.  Pt states that she know has developed a cough and sneezing.  Pt states she is having difficulty breathing.  Pt is tachycardic in triage.

## 2013-06-17 NOTE — ED Provider Notes (Signed)
Medical screening examination/treatment/procedure(s) were performed by a resident physician or non-physician practitioner and as the supervising physician I was immediately available for consultation/collaboration.  Rajveer Handler, MD    Jacqueline Spofford S Neelie Welshans, MD 06/17/13 0753 

## 2013-06-17 NOTE — ED Notes (Signed)
Pt ambulating independently w/ steady gait on d/c in no acute distress, A&Ox4. D/c instructions reviewed w/ pt - pt denies any further questions or concerns at present. Rx given x1  

## 2013-06-17 NOTE — ED Provider Notes (Signed)
CSN: [id number]     Arrival date & time 06/17/13  2016 History   First MD Initiated Contact with Patient 06/17/13 2055     Chief Complaint  Patient presents with  . Cough   (Consider location/radiation/quality/duration/timing/severity/associated sxs/prior Treatment) Patient is a 23 y.o. female presenting with cough. The history is provided by the patient.  Cough  patient here complaining of cough and URI symptoms x2 days. Seen at urgent care yesterday for similar symptoms diagnosed with having URI. Cough has been nonproductive. Slight dyspnea with some wheezing. Denies any vomiting or diarrhea. No headache or neck pain. Patient denies photophobia. Patient has been using over-the-counter medications without relief. Denies any urinary symptoms.  Past Medical History  Diagnosis Date  . Hx of varicella   . 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 Cesarean section N/A 11/19/2012    Procedure: CESAREAN SECTION;  Surgeon: Meriel Pica, MD;  Location: [facility];  Service: Obstetrics;  Laterality: N/A;  . Cesarean section     Family History  Problem Relation Age of Onset  . Asthma Neg Hx   . Birth defects Neg Hx   . Cancer Neg Hx   . Depression Neg Hx   . Diabetes Neg Hx   . Hearing loss Neg Hx   . Heart disease Neg Hx   . Hyperlipidemia Neg Hx   . Hypertension Neg Hx   . Kidney disease Neg Hx   . Miscarriages / Stillbirths Neg Hx   . Stroke Neg Hx   . Arthritis Neg Hx   . Alcohol abuse Neg Hx   . COPD Neg Hx   . Drug abuse Neg Hx   . Early death Neg Hx   . Learning disabilities Neg Hx   . Mental illness Neg Hx   . Mental retardation Neg Hx   . Vision loss Neg Hx    History  Substance Use Topics  . Smoking status: Never Smoker   . Smokeless tobacco: Never Used  . Alcohol Use: No   OB History   Grav Para Term Preterm Abortions TAB SAB Ect Mult Living   1 1 1        1      Review of Systems  Respiratory: Positive for cough.   All other systems reviewed and are negative.    Allergies  Other  Home Medications   Current Outpatient Rx  Name  Route  Sig  Dispense  Refill  . ibuprofen (ADVIL,MOTRIN) 200 MG tablet   Oral   Take 400 mg by mouth every 6 (six) hours as needed (pain).         . Phenylephrine-DM-GG-APAP (TYLENOL COLD/FLU SEVERE) 5-10-200-325 MG TABS   Oral   Take 1-2 tablets by mouth daily as needed (flu-like symptoms).          BP 125/80  Pulse 124  Temp(Src) 99.9 F (37.7 C) (Oral)  Resp 18  SpO2 96%  LMP 06/15/2013 Physical Exam  Nursing note and vitals reviewed. Constitutional: She is oriented to person, place, and time. She appears well-developed and well-nourished.  Non-toxic appearance. No distress.  HENT:  Head: Normocephalic and atraumatic.  Eyes: Conjunctivae, EOM and lids are normal. Pupils are equal, round, and reactive to light.  Neck: Normal range of motion. Neck supple. No tracheal deviation present. No mass present.  Cardiovascular: Normal  rate, regular rhythm and normal heart sounds.  Exam reveals no gallop.   No murmur heard. Pulmonary/Chest: Effort normal. No stridor. No respiratory distress. She has decreased breath sounds. She has no wheezes. She has no rhonchi. She has no rales.  Abdominal: Soft. Normal appearance and bowel sounds are normal. She exhibits no distension. There is no tenderness. There is no rebound and no CVA tenderness.  Musculoskeletal: Normal range of motion. She exhibits no edema and no tenderness.  Neurological: She is alert and oriented to person, place, and time. She has normal strength. No cranial nerve deficit or sensory deficit. GCS eye subscore is 4. GCS verbal subscore is 5. GCS motor subscore is 6.  Skin: Skin is warm and dry. No abrasion and no rash noted.  Psychiatric: She has a normal mood and affect. Her speech is normal and behavior is normal.    ED Course   Procedures (including critical care time) Labs Review Labs Reviewed - No data to display Imaging Review Dg Chest 2 View  06/17/2013   CLINICAL DATA:  Chest pain, shortness of breath and cough.  EXAM: CHEST  2 VIEW  COMPARISON:  PA and lateral chest 11/26/2012.  FINDINGS: Heart size and mediastinal contours are within normal limits. Both lungs are clear. Visualized skeletal structures are unremarkable.  IMPRESSION: Negative exam.   Electronically Signed   By: Drusilla Kannerhomas  Dalessio M.D.   On: 06/17/2013 21:10    EKG Interpretation   None       MDM  No diagnosis found. Patient with negative chest x-ray and patient likely has a viral bronchitis. Will place patient on Nasacort inhaler discharge. Patient's tachycardia noted and likely from her low-grade temperature. No concern for PE    Toy BakerAnthony T Elvyn Krohn, MD 06/17/13 2116

## 2013-07-27 ENCOUNTER — Ambulatory Visit: Payer: 59 | Admitting: Family

## 2013-07-29 ENCOUNTER — Ambulatory Visit: Payer: 59 | Admitting: Family

## 2013-08-10 ENCOUNTER — Encounter: Payer: Self-pay | Admitting: Family

## 2013-08-10 ENCOUNTER — Ambulatory Visit (INDEPENDENT_AMBULATORY_CARE_PROVIDER_SITE_OTHER): Payer: 59 | Admitting: Family

## 2013-08-10 VITALS — BP 112/80 | HR 82 | Ht 65.0 in | Wt 227.0 lb

## 2013-08-10 DIAGNOSIS — M545 Low back pain, unspecified: Secondary | ICD-10-CM

## 2013-08-10 DIAGNOSIS — N62 Hypertrophy of breast: Secondary | ICD-10-CM

## 2013-08-10 NOTE — Patient Instructions (Signed)
Breast Reduction   Breast reduction (also called reduction mammoplasty) is surgery to reduce the size of your breasts. To do this, a surgeon removes fat, tissue, and excess skin. There are some common reasons for reduction mammoplasty. In some people, large breasts contribute to poor posture or chronic neck and back pain. Sometimes a rash develops under the breasts. Large breasts can also make it difficult to exercise. Before the procedure, you and your surgeon will decide on a new size and shape for your breasts. The surgery usually takes 2 4 hours.   After breast reduction, your breasts will be smaller. This should make movement easier.   LET YOUR HEALTH CARE PROVIDER KNOW ABOUT:  · Any allergies you have.  · All medicines you are taking, including vitamins, herbs, eye drops, creams, and over-the-counter medicines.  · Previous problems you or members of your family have had with the use of anesthetics.  · Any blood disorders you have.  · Previous surgeries you have had.  · Medical conditions you have.  · Any recent colds or fevers you have had.  RISKS AND COMPLICATIONS  Generally, this is a safe procedure. However, as with any procedure, complications can occur. Possible complications include:  · Excessive bleeding.  · Blood clots in your legs or lungs.  · Pool of blood forming near the wound (hematoma). This is caused by a broken blood vessel.  · Infection.  · Total or partial loss of feeling in your breasts or nipples. Discuss this with your surgeon before the surgery.  · Damage to the dark area around your nipple (areola).  · Loss of ability to breastfeed.  · Pneumonia. This is a risk with many surgeries.  BEFORE THE PROCEDURE  · Ask your health care provider about changing or stopping any regular medicines. Make sure you know when to stop taking nonsteroidal anti-inflammatory drugs (NSAIDs), vitamin E, and herbal supplements. These can increase your risk of bleeding during surgery.  · Do not eat or drink  anything for at least 8 hours before your surgery. Ask your health care provider about taking a sip of water with any approved medicines.  · Quit smoking. Smoking makes healing more difficult. It also makes scarring more likely.  · If you are older than 35 years, your surgeon might want you to have mammography before surgery.  · You might be asked to shower with an antibacterial soap before coming in for the procedure.  · Arrange for someone to drive you home.  PROCEDURE  · An IV access tube will be placed in one of your veins. You will be given medicine to make you sleep (general anesthetic).  · Cuts (incisions) will be made on your breasts.  · The surgeon will first remove fat, tissue, and excess skin. Then the surgeon will reshape your breasts. Your nipples might be removed, and then replaced, when the reshaping begins.  · The incisions will be closed with many tiny stitches.  · The surgeon may place small tubes under your skin to allow the wound to drain for a few days. You will be taught how to care for the drains.  · At the end of the surgery, your breasts will be bandaged securely with gauze and elastic bandages. This will protect your breasts for the first few days and promote healing.  AFTER THE PROCEDURE  · You will be taken to a recovery area. There, your recovery with be monitored. Your heartbeat, breathing rate, temperature, and blood pressure (vital   signs) will be checked.  · If you had outpatient surgery, you will be able to go home when you are stable and able to drink fluids. If you had inpatient surgery, you will be taken to your room.  · You might be given a special bra to wear while your breasts heal.  Document Released: 08/16/2008 Document Revised: 01/20/2013 Document Reviewed: 11/20/2012  ExitCare® Patient Information ©2014 Exit[employer], LLC.

## 2013-08-10 NOTE — Progress Notes (Signed)
Pre visit review using our clinic review tool, if applicable. No additional management support is needed unless otherwise documented below in the visit note. 
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
## 2013-08-16 ENCOUNTER — Telehealth: Payer: Self-pay

## 2013-08-16 NOTE — Telephone Encounter (Signed)
Per Joyice FasterGabby: After speaking with 2 separate plastic surgery locations, I was unable to obtain an appt for the patient. Insurance companies have a strict BMI policy for a breast reduction, there prefer for it to be under 30. In order for her to be considered a candidate she will need to participate in a weight loss program or see a nutritionist prior to considering a reduction.    Left message for pt to callback

## 2013-08-16 NOTE — Telephone Encounter (Signed)
Pt aware.

## 2014-03-29 ENCOUNTER — Other Ambulatory Visit: Payer: Self-pay | Admitting: Obstetrics & Gynecology

## 2014-03-31 LAB — CYTOLOGY - PAP

## 2014-04-04 ENCOUNTER — Encounter: Payer: Self-pay | Admitting: Family

## 2014-05-13 IMAGING — CR DG CHEST 2V
2 series · 2 of 2 positions shown · non-contrast
Comparison: None.

CLINICAL DATA: Fever and shortness of breath.

CHEST - 2 VIEW

[x chest ap]
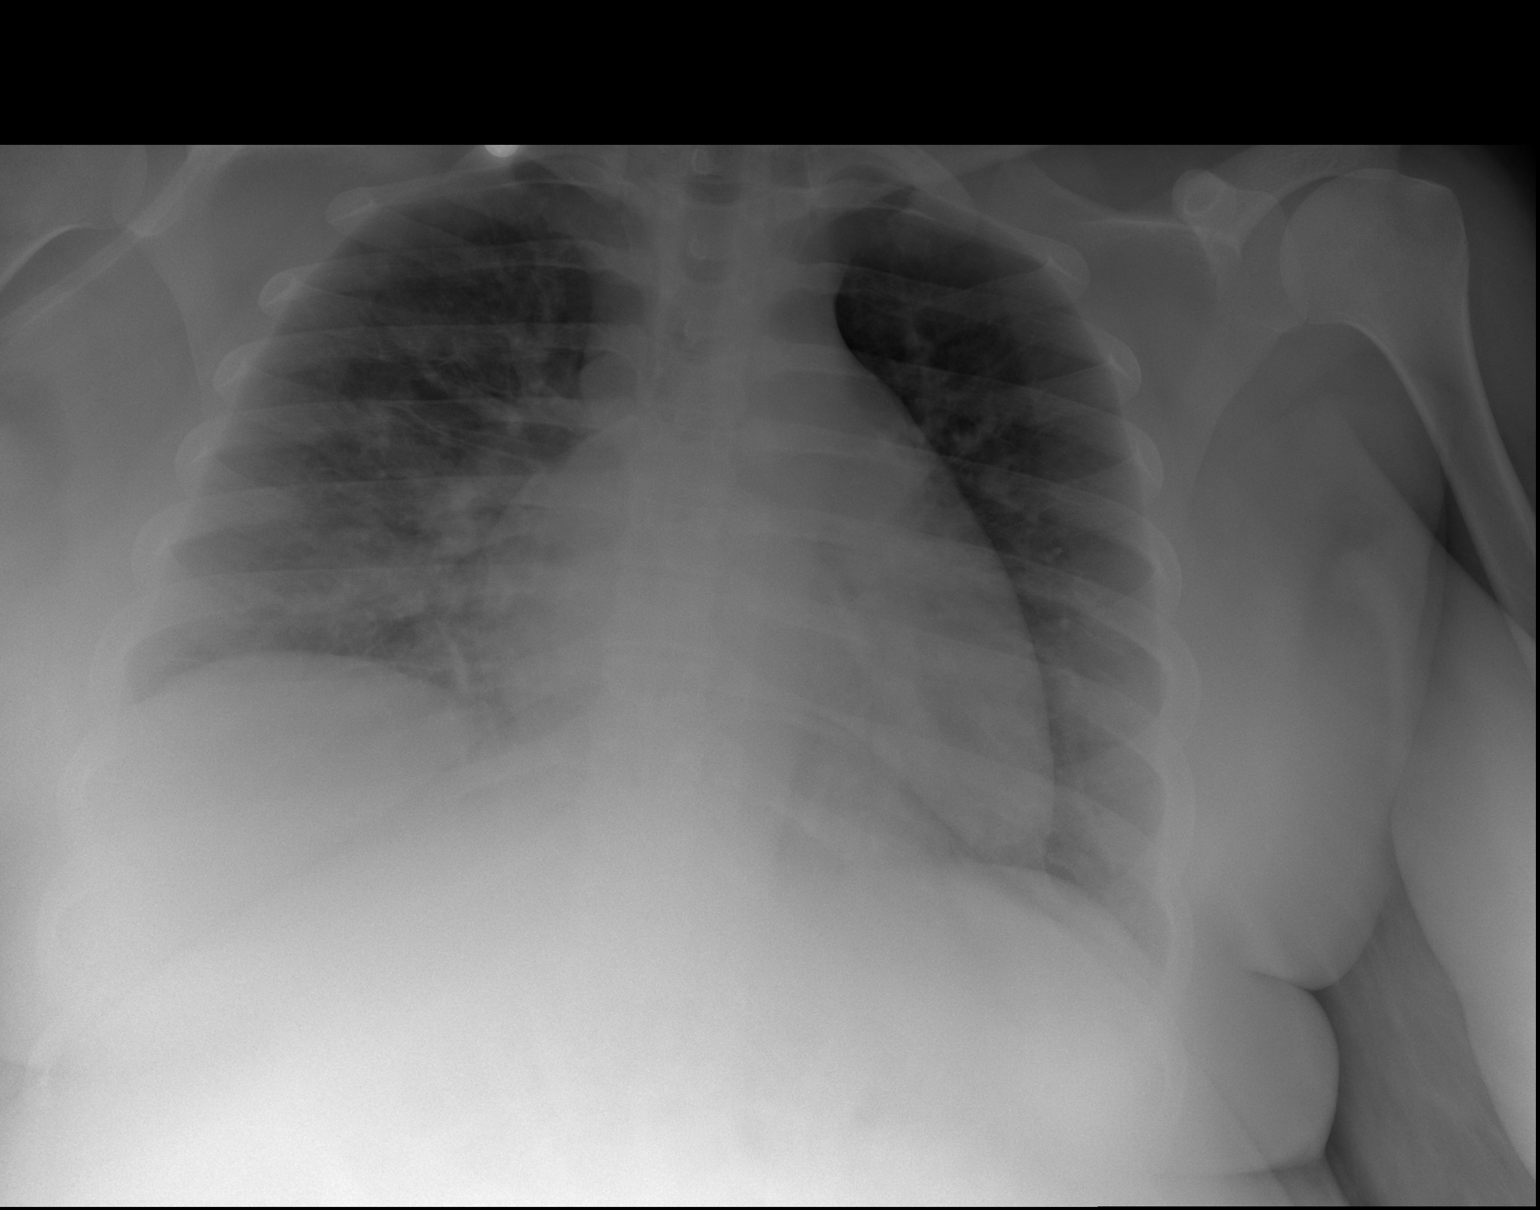

[w chest lat]
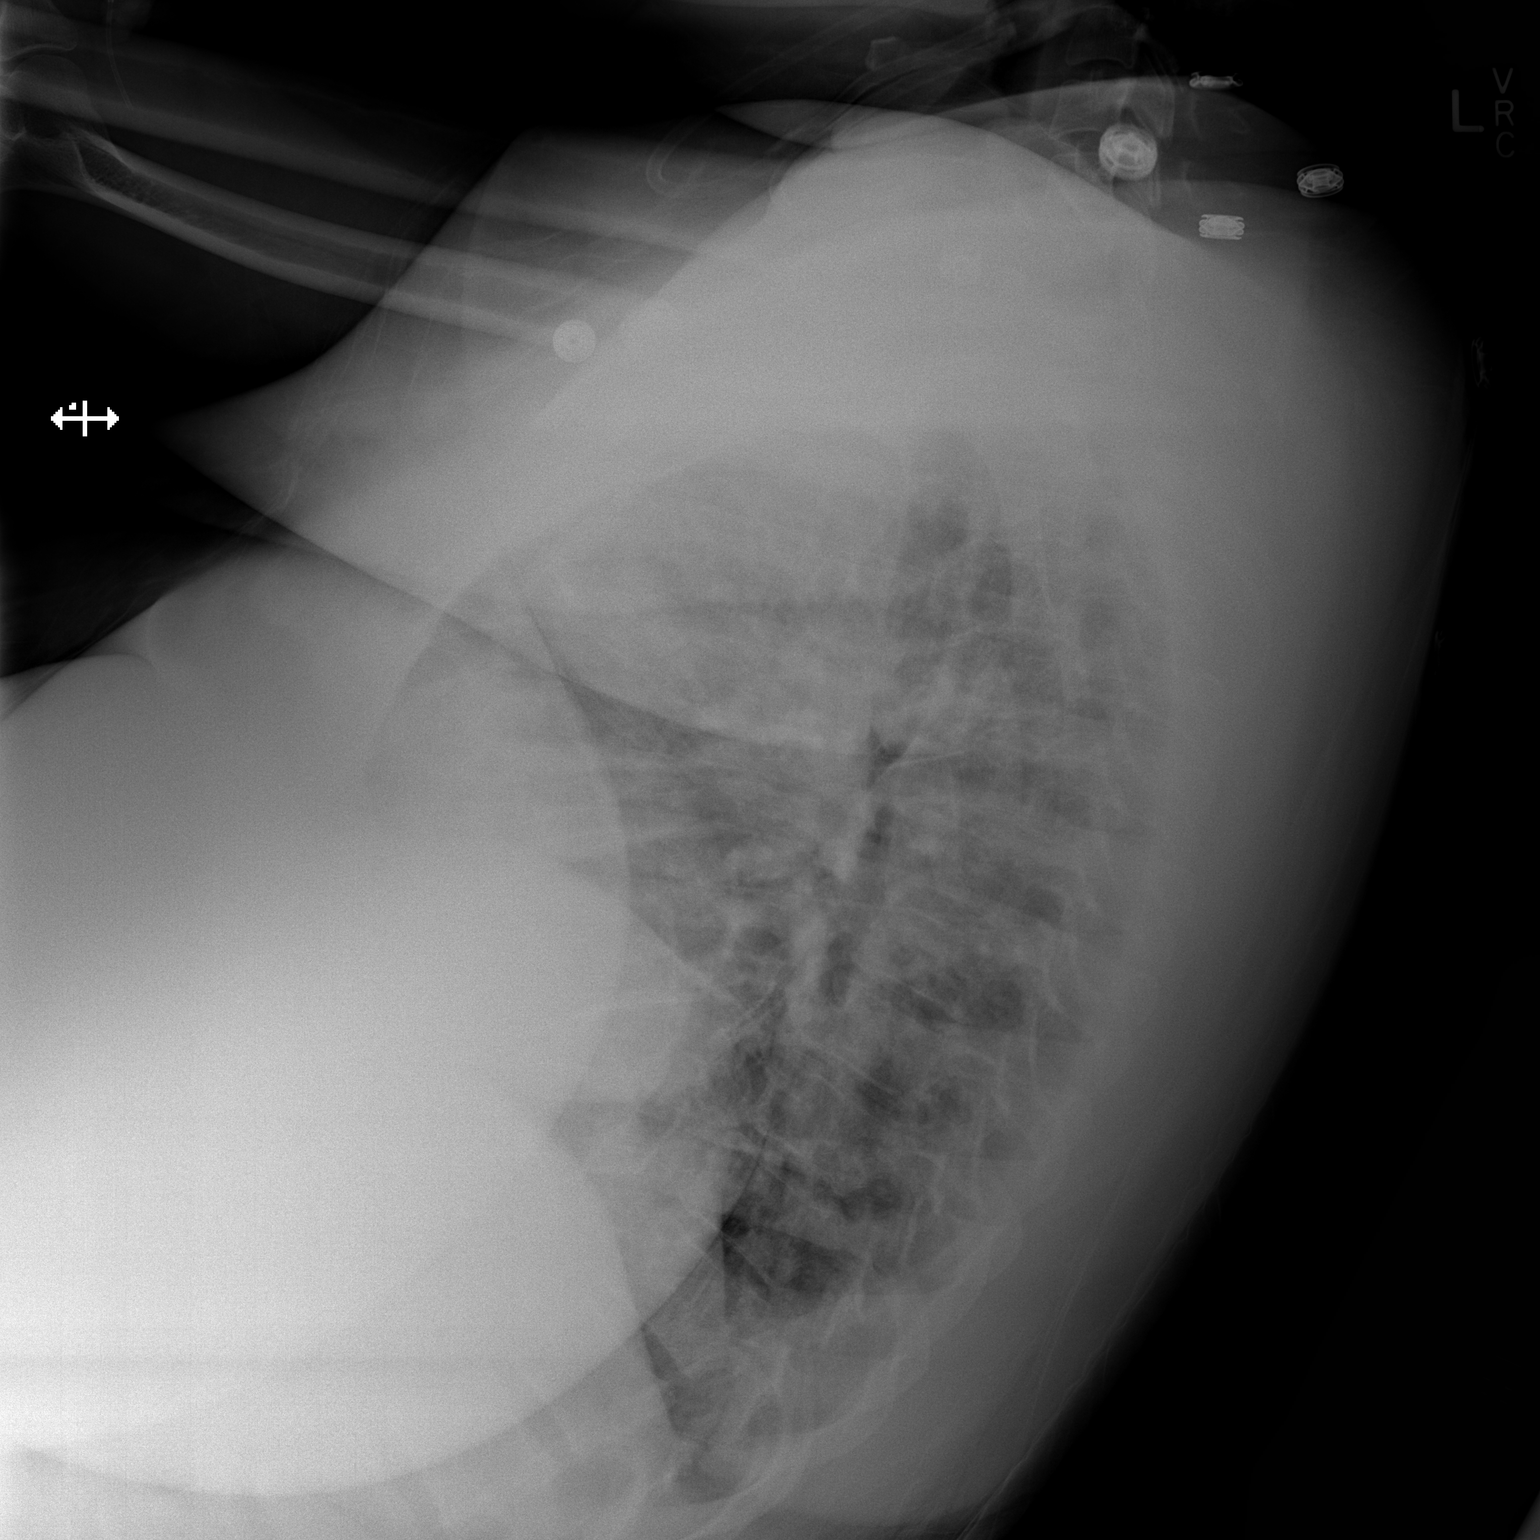

[2 of 2 positions shown; findings below may reference images not displayed]

FINDINGS: There is a faint right perihilar infiltrate extending
into the right lower lobe.  Left lung is clear.  Heart size and
vascularity are normal.  No acute osseous abnormality.  No
effusions.
IMPRESSION: Findings consistent with a right perihilar right lower lobe
infiltrate.  The markings may be at least in part accentuated by
the shallow inspiration.

## 2014-06-07 ENCOUNTER — Encounter (HOSPITAL_COMMUNITY): Payer: Self-pay | Admitting: Emergency Medicine

## 2014-06-07 ENCOUNTER — Emergency Department (HOSPITAL_COMMUNITY)
Admission: EM | Admit: 2014-06-07 | Discharge: 2014-06-07 | Disposition: A | Discharge status: Home / Self Care | Payer: 59 | Attending: Emergency Medicine | Admitting: Emergency Medicine

## 2014-06-07 ENCOUNTER — Emergency Department (HOSPITAL_COMMUNITY): Payer: 59

## 2014-06-07 DIAGNOSIS — Y92481 Parking lot as the place of occurrence of the external cause: Secondary | ICD-10-CM | POA: Diagnosis not present

## 2014-06-07 DIAGNOSIS — S80212A Abrasion, left knee, initial encounter: Secondary | ICD-10-CM | POA: Diagnosis not present

## 2014-06-07 DIAGNOSIS — Y998 Other external cause status: Secondary | ICD-10-CM | POA: Insufficient documentation

## 2014-06-07 DIAGNOSIS — S5012XA Contusion of left forearm, initial encounter: Secondary | ICD-10-CM | POA: Insufficient documentation

## 2014-06-07 DIAGNOSIS — Z8619 Personal history of other infectious and parasitic diseases: Secondary | ICD-10-CM | POA: Insufficient documentation

## 2014-06-07 DIAGNOSIS — S0001XA Abrasion of scalp, initial encounter: Secondary | ICD-10-CM | POA: Diagnosis not present

## 2014-06-07 DIAGNOSIS — Y9389 Activity, other specified: Secondary | ICD-10-CM | POA: Insufficient documentation

## 2014-06-07 DIAGNOSIS — Z23 Encounter for immunization: Secondary | ICD-10-CM | POA: Diagnosis not present

## 2014-06-07 DIAGNOSIS — L089 Local infection of the skin and subcutaneous tissue, unspecified: Secondary | ICD-10-CM

## 2014-06-07 DIAGNOSIS — S0990XA Unspecified injury of head, initial encounter: Secondary | ICD-10-CM | POA: Diagnosis present

## 2014-06-07 DIAGNOSIS — S70212A Abrasion, left hip, initial encounter: Secondary | ICD-10-CM | POA: Diagnosis not present

## 2014-06-07 DIAGNOSIS — S60512A Abrasion of left hand, initial encounter: Secondary | ICD-10-CM

## 2014-06-07 DIAGNOSIS — W19XXXA Unspecified fall, initial encounter: Secondary | ICD-10-CM

## 2014-06-07 MED ORDER — TRAMADOL HCL 50 MG PO TABS: 100.0000 mg | ORAL_TABLET | Freq: Four times a day (QID) | ORAL | Status: AC | PRN

## 2014-06-07 MED ORDER — SILVER SULFADIAZINE 1 % EX CREA
TOPICAL_CREAM | Freq: Once | CUTANEOUS | Status: AC
  Administered 2014-06-07: 04:00:00 via TOPICAL
  Filled 2014-06-07: qty 85

## 2014-06-07 MED ORDER — KETOROLAC TROMETHAMINE 30 MG/ML IJ SOLN
30.0000 mg | Freq: Once | INTRAMUSCULAR | Status: AC
  Administered 2014-06-07: 30 mg via INTRAMUSCULAR
  Filled 2014-06-07: qty 1

## 2014-06-07 MED ORDER — TETANUS-DIPHTH-ACELL PERTUSSIS 5-2.5-18.5 LF-MCG/0.5 IM SUSP
0.5000 mL | Freq: Once | INTRAMUSCULAR | Status: AC
  Administered 2014-06-07: 0.5 mL via INTRAMUSCULAR
  Filled 2014-06-07: qty 0.5

## 2014-06-07 MED ORDER — IBUPROFEN 200 MG PO TABS
400.0000 mg | ORAL_TABLET | Freq: Once | ORAL | Status: AC
  Administered 2014-06-07: 400 mg via ORAL
  Filled 2014-06-07: qty 2
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
## 2014-06-07 NOTE — ED Notes (Signed)
Patient states she drove herself here and will drive self home.
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
## 2014-06-07 NOTE — Discharge Instructions (Signed)
Keep the abrasions clean and dry. The dressings need to be changed daily for the next couple of days. This [facility] be done [facility]. Take ibuprofen 600 mg 4 times a day OR aleve 2 tabs twice a day for pain. You can also take the tramadol with acetaminophen 1000 mg 4 times a day for pain.  Return to the ED for any problems noted on the head injury sheet.

## 2014-10-09 HISTORY — PX: HX BREAST REDUCTION: SHX8

## 2015-08-04 ENCOUNTER — Ambulatory Visit (HOSPITAL_COMMUNITY): Payer: Self-pay | Admitting: Specialist

## 2016-05-16 ENCOUNTER — Ambulatory Visit (INDEPENDENT_AMBULATORY_CARE_PROVIDER_SITE_OTHER): Payer: Self-pay

## 2016-06-06 ENCOUNTER — Encounter (INDEPENDENT_AMBULATORY_CARE_PROVIDER_SITE_OTHER): Payer: Self-pay

## 2016-06-06 ENCOUNTER — Ambulatory Visit (INDEPENDENT_AMBULATORY_CARE_PROVIDER_SITE_OTHER): Payer: PRIVATE HEALTH INSURANCE

## 2016-06-06 VITALS — BP 104/68 | Ht 65.0 in | Wt 249.0 lb

## 2016-06-06 DIAGNOSIS — E8881 Metabolic syndrome: Secondary | ICD-10-CM

## 2016-06-06 DIAGNOSIS — N6320 Unspecified lump in the left breast, unspecified quadrant: Secondary | ICD-10-CM

## 2016-06-06 DIAGNOSIS — Z01419 Encounter for gynecological examination (general) (routine) without abnormal findings: Secondary | ICD-10-CM

## 2016-06-06 LAB — HGA1C (HEMOGLOBIN A1C WITH EST AVG GLUCOSE)
ESTIMATED AVERAGE GLUCOSE: 123 mg/dL — ABNORMAL HIGH (ref 77–114)
HEMOGLOBIN A1C: 5.9 %

## 2016-06-06 NOTE — Progress Notes (Signed)
[facility] MEDICAL OFFICE [facility]  [facility]  [address]  [place] [address]  [phone]        Patient name:  Laura Stanton  MRN:  [id number]  DOB:  1990-10-23  DATE:  06/06/2016      Gillian Scarce  26 y.o. G2P1011 who presents for annual exam. She reports that normally she has monthly periods that last about 1 week.  Sometimes, however, she will skip 1-2 periods.  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  She was apparently given a prescription for Metformin in the past but never had it filled.  Pt says that she has completed the Gardasil series.     Past Surgical History:   Procedure Laterality Date   . HX BREAST REDUCTION  10/09/2014   . HX CESAREAN SECTION         Past Medical History:   Diagnosis Date   . Vaginal infection        No current outpatient prescriptions on file.     No current facility-administered medications for this visit.      No Known Allergies  Family Medical History     None          Social History     Social History   . Marital status: Single     Spouse name: N/A   . Number of children: N/A   . Years of education: N/A     Occupational History   . Not on file.     Social History Main Topics   . Smoking status: Never Smoker   . Smokeless tobacco: Never Used   . Alcohol use No   . Drug use: No   . Sexual activity: Yes     Partners: Male     Birth control/ protection: None     Other Topics Concern   . Not on file     Social History Narrative   . No narrative on file       Review of Systems    Constitutional: Good general healthy lately.  Negative for fever, recent weight change.  Eyes, ears, nose, throat: Negative for glaucoma, blurred or double vision, sinus problems, earaches and drainage, mouth sores, swollen glands in neck, sore throat, difficulty swallowing, hearing problems.  Cardiovascular:  Negative for heart trouble/heart attack, chest pain or irregular heart beat, high blood pressure, high cholesterol, blood clots.  Respiratory:  Negative for cough, shortness of breath, asthma, or wheezing.  Gastrointestinal: Negative for loss of appetite, change in bowel movement, blood in stool, stomach ulcer, abdominal pain, liver disease/problems.   Genitourinary:  Negative for frequent urination, burning or painful urination, blood in urine, leakage or dribbling, vaginal discharge, sexual difficulty, history of sexually transmitted diseases. Menstrual cycle as described in the HPI.   Endocrine:  Negative for excessive thirst or urination, heat or cold intolerance.  Integument/breast: Negative for rash or itching, breast pain, breast discharge or lump.  Neurological:  Negative for convulsions or seizures, tremors, and stroke/head injury.  Musculoskeletal:  Negative for history of deep blood clots and difficulty with walking.  Hematologic/lymphatic: negative for easy bruising, bleeding, lymphadenopathy.      Objective:      BP 104/68  Ht 1.651 m (5\' 5" )  Wt 112.9 kg (249 lb)  LMP 05/09/2016  Breastfeeding? No  BMI 41.44 kg/m2  General:  Alert, cooperative, no distress, appears stated age.   Head:  Normocephalic, without obvious abnormality, atraumatic.  Eyes:  Conjunctivae/corneas clear. PERRL, EOMs intact. Sclera - non icteric   Ears:  External ear WNL.   Nose: Nares normal. Septum midline. Mucosa normal. No drainage or sinus tenderness.   Throat: Lips, mucosa, and tongue normal. Teeth and gums normal.   Neck: Supple, symmetrical, trachea midline, no adenopathy, thyroid: no enlargment/tenderness/nodules   Lungs:   Clear to auscultation bilaterally.   Chest wall:  No tenderness or deformity.   Heart:  Regular rate and rhythm, S1, S2 normal, no murmur, click, rub or gallop.   Breast Exam:  No tenderness or nipple abnormality. Pt has obvious scars from previous breast reduction surgery.  There is also a 2 cm nodule just medial to the left nipple. This nodule is smooth and non-tender.  Pt says that it has been present her breast reduction.   Abdomen:    Soft, non-tender. Bowel sounds normal. No masses,  No organomegaly.   Genitalia:  Normal female without lesion,scarring or rashes.  Vagina - pink without any lesions or abnormal discharge.  Uterus - midline, mobile and normal in size and contour. Cervix - without lesion,pap and cultures collected. adnexa without mass bilaterally   Extremities: Extremities normal, atraumatic, no cyanosis or edema.   Skin: Skin color, texture, turgor normal. No rashes or lesions.   Lymph nodes: Cervical, supraclavicular, and axillary nodes normal.   Neurologic: Grossly intact.        Assessment:     Healthy female exam.   H/o insulin resistance.     Plan:     Await Pap results.  GC specimen.  Chlamydia specimen.  Check Hgb A1c  Pt declines contraception.   I advised that she start PNV now.        Dianna RossettiEmily Ferrell Claiborne, MD  06/06/2016, 15:39

## 2016-06-07 ENCOUNTER — Other Ambulatory Visit (INDEPENDENT_AMBULATORY_CARE_PROVIDER_SITE_OTHER): Payer: Self-pay

## 2016-06-07 MED ORDER — METFORMIN ER 500 MG TABLET,EXTENDED RELEASE 24 HR
500.0000 mg | ORAL_TABLET | Freq: Every day | ORAL | 11 refills | Status: DC
Start: 2016-06-07 — End: 2018-05-05

## 2016-06-10 ENCOUNTER — Other Ambulatory Visit (INDEPENDENT_AMBULATORY_CARE_PROVIDER_SITE_OTHER): Payer: Self-pay

## 2016-06-10 DIAGNOSIS — Z01419 Encounter for gynecological examination (general) (routine) without abnormal findings: Secondary | ICD-10-CM

## 2016-07-02 DIAGNOSIS — N63 Unspecified lump in unspecified breast: Secondary | ICD-10-CM

## 2016-07-02 DIAGNOSIS — N6322 Unspecified lump in the left breast, upper inner quadrant: Secondary | ICD-10-CM

## 2016-09-04 ENCOUNTER — Other Ambulatory Visit (INDEPENDENT_AMBULATORY_CARE_PROVIDER_SITE_OTHER): Payer: Self-pay

## 2016-11-13 ENCOUNTER — Ambulatory Visit (INDEPENDENT_AMBULATORY_CARE_PROVIDER_SITE_OTHER): Payer: Self-pay

## 2016-11-13 DIAGNOSIS — Z029 Encounter for administrative examinations, unspecified: Secondary | ICD-10-CM

## 2017-10-13 ENCOUNTER — Encounter (INDEPENDENT_AMBULATORY_CARE_PROVIDER_SITE_OTHER): Payer: Self-pay

## 2018-03-19 ENCOUNTER — Ambulatory Visit (INDEPENDENT_AMBULATORY_CARE_PROVIDER_SITE_OTHER): Payer: Self-pay

## 2018-05-05 ENCOUNTER — Ambulatory Visit (INDEPENDENT_AMBULATORY_CARE_PROVIDER_SITE_OTHER): Payer: BC Managed Care – PPO

## 2018-05-05 ENCOUNTER — Encounter (INDEPENDENT_AMBULATORY_CARE_PROVIDER_SITE_OTHER): Payer: Self-pay

## 2018-05-05 VITALS — BP 110/64 | Ht 65.0 in | Wt 201.0 lb

## 2018-05-05 DIAGNOSIS — Z3480 Encounter for supervision of other normal pregnancy, unspecified trimester: Secondary | ICD-10-CM

## 2018-05-05 DIAGNOSIS — Z3481 Encounter for supervision of other normal pregnancy, first trimester: Secondary | ICD-10-CM

## 2018-05-05 DIAGNOSIS — R350 Frequency of micturition: Secondary | ICD-10-CM

## 2018-05-05 DIAGNOSIS — Z01419 Encounter for gynecological examination (general) (routine) without abnormal findings: Secondary | ICD-10-CM

## 2018-05-05 NOTE — Progress Notes (Signed)
Pt here for new OB visit.  She says that she has had some nausea but no vomiting.  She also describes a lot of fatigue.  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  I advised pt to be careful that when she does eat, she should really try to make sure that the food is nutrient dense.  Pt should also continue to take PNV's.  Anticipatory guidance given.  I discussed trisomy and carrier screening with pt and her FOB.  Check PNL's and u/s for dating and viability today.  Pt had c/s for failure to dilate with her first delivery.  Will plan RLTCS at 39+ weeks.

## 2018-05-05 NOTE — Procedures (Signed)
See ultrasound report.

## 2018-05-05 NOTE — Patient Instructions (Signed)
Call for any problems.

## 2018-05-06 LAB — URINE CULTURE

## 2018-05-07 ENCOUNTER — Other Ambulatory Visit (INDEPENDENT_AMBULATORY_CARE_PROVIDER_SITE_OTHER): Payer: Self-pay

## 2018-05-07 DIAGNOSIS — Z3480 Encounter for supervision of other normal pregnancy, unspecified trimester: Secondary | ICD-10-CM

## 2018-05-19 ENCOUNTER — Telehealth (INDEPENDENT_AMBULATORY_CARE_PROVIDER_SITE_OTHER): Payer: Self-pay

## 2018-05-19 NOTE — Telephone Encounter (Signed)
Pt states she is [redacted] weeks pregnant and she has a brownish vaginal discharge with a fishy odor with itching , no bumps no blisters no burning , she states its really bothers her and she would like something sent in please

## 2018-05-20 NOTE — Telephone Encounter (Signed)
Called pt x2 no answer eft detailed message per HIPAA , to call for appointment

## 2018-05-20 NOTE — Telephone Encounter (Signed)
From her description, it sounds like pt may have BV.  If that is the case, we prefer to delayed treatment until second trimester.  Pt may also have an odor due to some very mild bleeding.  She needs to be seen before she can be appropriately treated.

## 2018-05-21 ENCOUNTER — Ambulatory Visit (INDEPENDENT_AMBULATORY_CARE_PROVIDER_SITE_OTHER): Payer: BC Managed Care – PPO

## 2018-05-21 VITALS — BP 114/68 | Wt 199.0 lb

## 2018-05-21 DIAGNOSIS — N898 Other specified noninflammatory disorders of vagina: Secondary | ICD-10-CM

## 2018-05-21 DIAGNOSIS — O26899 Other specified pregnancy related conditions, unspecified trimester: Secondary | ICD-10-CM

## 2018-05-21 DIAGNOSIS — O26891 Other specified pregnancy related conditions, first trimester: Secondary | ICD-10-CM

## 2018-05-21 DIAGNOSIS — Z3A10 10 weeks gestation of pregnancy: Secondary | ICD-10-CM

## 2018-05-21 LAB — PC POCT URINE DIPSTICK
BILIRUBIN: NEGATIVE
BILIRUBIN: NEGATIVE
GLUCOSE: NEGATIVE
LEUKOCYTES: NEGATIVE
NITRITE: NEGATIVE
PH: 6
PROTEIN: NEGATIVE
SPECIFIC GRAVITY: >=1.03

## 2018-05-21 NOTE — Progress Notes (Signed)
Pt here for evaluation of vaginal discharge.  She says that she has noticed a brownish vaginal d/c that is associated with some mild itching.  She also thinks that she has increased odor.  Ext gen - normal adult female.  Vagina- no lesions.  sm amount of mucoid, white discharge.  Wet mount - there is a predominance of bacilli.  There are also occ. Clue cells.  I told pt that the wet mount is equivocal.  I also told her that I would prefer not to treat with flagyl in the first trimester.  Pt has an appt on 06/10/2018.  If she is still having symptoms then, will treat.  Pt also complained of continue nausea.  She has not try the B6 and unisom.  I encouraged her to take that every night.  Pt desires trisomy and carrier screening.  Will draw these labs at next visit.

## 2018-05-21 NOTE — Progress Notes (Signed)
uri

## 2018-06-10 ENCOUNTER — Ambulatory Visit (INDEPENDENT_AMBULATORY_CARE_PROVIDER_SITE_OTHER): Payer: BC Managed Care – PPO

## 2018-06-10 VITALS — BP 118/72 | Wt 191.0 lb

## 2018-06-10 DIAGNOSIS — Z3481 Encounter for supervision of other normal pregnancy, first trimester: Secondary | ICD-10-CM

## 2018-06-10 DIAGNOSIS — Z3480 Encounter for supervision of other normal pregnancy, unspecified trimester: Secondary | ICD-10-CM

## 2018-06-10 NOTE — Patient Instructions (Signed)
Call for any problems.

## 2018-06-12 NOTE — Progress Notes (Signed)
Pt is ding well.  She has no new complaints today.  She still has occasional brownish vaginal discharge discharge. Trisomy and carrier screening today.

## 2018-06-17 ENCOUNTER — Other Ambulatory Visit (INDEPENDENT_AMBULATORY_CARE_PROVIDER_SITE_OTHER): Payer: Self-pay

## 2018-06-23 ENCOUNTER — Other Ambulatory Visit (INDEPENDENT_AMBULATORY_CARE_PROVIDER_SITE_OTHER): Payer: Self-pay

## 2018-07-16 ENCOUNTER — Other Ambulatory Visit (INDEPENDENT_AMBULATORY_CARE_PROVIDER_SITE_OTHER): Payer: Self-pay

## 2018-07-16 ENCOUNTER — Encounter (INDEPENDENT_AMBULATORY_CARE_PROVIDER_SITE_OTHER): Payer: Self-pay

## 2018-09-01 ENCOUNTER — Other Ambulatory Visit (INDEPENDENT_AMBULATORY_CARE_PROVIDER_SITE_OTHER): Payer: Self-pay | Admitting: Women's Health

## 2018-09-01 DIAGNOSIS — Z3482 Encounter for supervision of other normal pregnancy, second trimester: Secondary | ICD-10-CM

## 2018-12-11 ENCOUNTER — Encounter (INDEPENDENT_AMBULATORY_CARE_PROVIDER_SITE_OTHER): Payer: Self-pay

## 2019-03-08 ENCOUNTER — Encounter (HOSPITAL_COMMUNITY): Payer: Self-pay

## 2021-11-14 ENCOUNTER — Other Ambulatory Visit: Payer: Self-pay | Admitting: Family Medicine

## 2022-06-11 ENCOUNTER — Encounter (HOSPITAL_BASED_OUTPATIENT_CLINIC_OR_DEPARTMENT_OTHER): Payer: MEDICAID

## 2022-06-11 DIAGNOSIS — I088 Other rheumatic multiple valve diseases: Secondary | ICD-10-CM

## 2022-11-05 ENCOUNTER — Encounter (INDEPENDENT_AMBULATORY_CARE_PROVIDER_SITE_OTHER): Payer: Self-pay | Admitting: OBSTETRICS/GYNECOLOGY

## 2022-11-05 ENCOUNTER — Other Ambulatory Visit: Payer: Self-pay

## 2022-11-05 ENCOUNTER — Ambulatory Visit: Payer: MEDICAID | Attending: OBSTETRICS/GYNECOLOGY | Admitting: OBSTETRICS/GYNECOLOGY

## 2022-11-05 VITALS — BP 102/74 | Ht 65.0 in | Wt 188.0 lb

## 2022-11-05 DIAGNOSIS — Z113 Encounter for screening for infections with a predominantly sexual mode of transmission: Secondary | ICD-10-CM | POA: Insufficient documentation

## 2022-11-05 DIAGNOSIS — N920 Excessive and frequent menstruation with regular cycle: Secondary | ICD-10-CM | POA: Insufficient documentation

## 2022-11-05 DIAGNOSIS — Z124 Encounter for screening for malignant neoplasm of cervix: Secondary | ICD-10-CM | POA: Insufficient documentation

## 2022-11-05 DIAGNOSIS — Z01419 Encounter for gynecological examination (general) (routine) without abnormal findings: Secondary | ICD-10-CM | POA: Insufficient documentation

## 2022-11-05 DIAGNOSIS — N946 Dysmenorrhea, unspecified: Secondary | ICD-10-CM | POA: Insufficient documentation

## 2022-11-05 DIAGNOSIS — Z8349 Family history of other endocrine, nutritional and metabolic diseases: Secondary | ICD-10-CM | POA: Insufficient documentation

## 2022-11-05 MED ORDER — TRANEXAMIC ACID 650 MG TABLET
1300.0000 mg | ORAL_TABLET | Freq: Three times a day (TID) | ORAL | 4 refills | Status: DC
Start: 2022-11-05 — End: 2022-12-17
  Filled 2022-11-05: qty 30, 5d supply, fill #0

## 2022-11-05 NOTE — Progress Notes (Signed)
[facility]  OB/GYN, [facility]  [address]  [place] [address]  [phone]         Annual    PATIENT: Laura Stanton  CHART NUMBER: [id number]  DATE OF SERVICE: 11/05/2022    CC:   Chief Complaint   Patient presents with    New Patient     Establish care c/o heavy menstrual bleeding and cramping        HPI: Laura Stanton is a 32 y.o. year old G3P1011 for annual exam.   Periods were abnormal in dec, jan, - did skip and irreg -- but now back regular.  First 3 days heavy and cramping.  Clots - quarter size.  Sexually active.  Anemic - has had to have iron infusions.  Interested in sti screening.  No pain or bleeding with sex.      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  Condoms.  Has tried iud in the past without tolerating it well. Had it removed early.   H/o anemia - had iron infusions over winter.     d/w pt options for treatment including obs vs med mngt vs surgical mngt.  d/w pt failure rate of ablation and mirena IUD are comparable.  D/w pt tx for fibroids, including [place].  Pt reports fam hx endometriosis.  Also d/w pt dx lps, d&c, hscope.  All questions answered.  Pt to consider options.     OB History:  OB History   Gravida Para Term Preterm AB Living   3 2 1   1 1    SAB IAB Ectopic Multiple Live Births           1      # Outcome Date GA Lbr Len/2nd Weight Sex Delivery Anes PTL Lv   3 Term 11/19/12     CS-LTranv      2 Para            1 AB                PAST MEDICAL HISTORY:  Past Medical History:   Diagnosis Date    Deficiency anemia     Depression     Headache     Seizure (CMS HCC)     Vaginal infection          PAST SURGICAL HISTORY:  Past Surgical History:   Procedure Laterality Date    HX BLOOD TRANSFUSION      HX BREAST REDUCTION  10/09/2014    HX CESAREAN SECTION      HX GASTRIC SLEEVE      VERTICAL SLEEVE         FAMILY HISTORY:  Family Medical History:       Problem Relation (Age of Onset)    Mental illness Maternal Grandmother, Mother    Thyroid Disease  Mother            SOCIAL HISTORY:  Social History     Socioeconomic History    Marital status: Single   Tobacco Use    Smoking status: Never    Smokeless tobacco: Never   Vaping Use    Vaping status: Never Used   Substance and Sexual Activity    Alcohol use: No    Drug use: No    Sexual activity: Yes     Partners: Male     Birth control/protection: None      CURRENT MEDICATIONS:   Current Outpatient Medications   Medication Sig  Ibuprofen (MOTRIN) 800 mg Oral Tablet     PNV no.133/ferrous fum/folic (PRENATAL ORAL) Take by mouth    SEMAGLUTIDE, WEIGHT LOSS, SUBQ     tranexamic acid (LYSTEDA) 650 mg Oral Tablet Take 2 Tablets (1,300 mg total) by mouth Three times a day on heavy flow days        ALLERGIES:  Patient has no known allergies.     ROS:  As discussed in HPI     PHYSICAL EXAMINATION:   Vitals:    11/05/22 1428   BP: 102/74   Weight: 85.3 kg (188 lb)   Height: 1.651 m (5\' 5" )   BMI: 31.35      Physical Exam  Constitutional:       Appearance: Normal appearance.   Genitourinary:      Vulva, bladder and urethral meatus normal.      Right Labia: No rash, tenderness or lesions.     Left Labia: No tenderness, lesions or rash.     No vaginal discharge, erythema or bleeding.        Right Adnexa: not tender and no mass present.     Left Adnexa: not tender and no mass present.     No cervical motion tenderness, discharge or lesion.      Uterus is enlarged (12 wk size, mobile, globular c/w fibroids.).      Uterus is not fixed or tender.      No uterine mass detected.  Breasts:     Right: Normal. No inverted nipple, mass, nipple discharge, skin change or tenderness.      Left: Normal. No inverted nipple, mass, nipple discharge, skin change or tenderness.      Breast exam comments: S/p bilateral reduction.  Inc well healed.Marland Kitchen  HENT:      Head: Normocephalic.   Cardiovascular:      Rate and Rhythm: Normal rate and regular rhythm.   Pulmonary:      Effort: Pulmonary effort is normal.      Breath sounds: Normal breath  sounds.   Abdominal:      General: There is no distension.      Palpations: Abdomen is soft. There is no mass.      Tenderness: There is no abdominal tenderness.   Musculoskeletal:         General: No swelling.      Cervical back: Neck supple.   Neurological:      Mental Status: She is alert and oriented to person, place, and time.   Skin:     General: Skin is warm.   Psychiatric:         Behavior: Behavior normal.   Vitals reviewed.          Data Reviewed:   Result Date Procedure Results Follow-ups Next Due   05/07/2018 CYTOPATHOLOGY-GYN (PAP AND HPV TESTS)      06/10/2016 CYTOPATHOLOGY, GYN +/- HIGH RISK HPV         No results found for this or any previous visit (from the past 47829 hour(s)).       ASSESSMENT/PLAN:    ICD-10-CM    1. Encounter for well woman exam with routine gynecological exam  Z01.419 CYTOPATHOLOGY, GYN +/- HIGH RISK HPV      2. Menorrhagia with regular cycle  N92.0 GLUCOSE FASTING     INSULIN, SERUM     THYROXINE, FREE (FREE T4)     PROLACTIN     TESTOSTERONE FREE (DIALYSIS) AND TOTAL,MS     56213 -  Pelvic Transvaginal     THYROID STIMULATING HORMONE WITH FREE T4 REFLEX     CBC/DIFF     IRON TRANSFERRIN AND TIBC     tranexamic acid (LYSTEDA) 650 mg Oral Tablet      3. Dysmenorrhea  N94.6 GLUCOSE FASTING     INSULIN, SERUM     THYROXINE, FREE (FREE T4)     PROLACTIN     TESTOSTERONE FREE (DIALYSIS) AND TOTAL,MS     [address]109 - Pelvic Transvaginal     THYROID STIMULATING HORMONE WITH FREE T4 REFLEX     CBC/DIFF     IRON TRANSFERRIN AND TIBC      4. Screening for cervical cancer  Z12.4 CYTOPATHOLOGY, GYN +/- HIGH RISK HPV      5. Screen for sexually transmitted diseases  Z11.3 CHLAMYDIA TRACHOMATIS/NEISSERIA GONORRHOEAE RNA, NAAT        Pap, hpv.  Gc/chlam  Check pcos labs, cbc, iron studies.  Sched tvs  Rx lysteda  Rto 4-6 wks f/u gyn.      Return in about 6 weeks (around 12/17/2022).       Golden Pop, MD

## 2022-11-06 ENCOUNTER — Other Ambulatory Visit: Payer: Self-pay

## 2022-11-06 LAB — CYTOPATHOLOGY, GYN +/- HIGH RISK HPV

## 2022-11-07 LAB — CHLAMYDIA TRACHOMATIS/NEISSERIA GONORRHOEAE RNA, NAAT
CHLAMYDIA TRACHOMATIS RNA: NEGATIVE
NEISSERIA GONORRHEA GC RNA: NEGATIVE

## 2022-11-08 LAB — HUMAN PAPILLOMA VIRUS (HPV) BY PCR WITH HIGH RISK GENOTYPING (THINPREP)
HPV OTHER: NEGATIVE
HPV16 PCR: NEGATIVE
HPV18 PCR: NEGATIVE

## 2022-11-13 ENCOUNTER — Other Ambulatory Visit: Payer: Self-pay

## 2022-11-13 ENCOUNTER — Ambulatory Visit: Payer: MEDICAID | Attending: OBSTETRICS/GYNECOLOGY

## 2022-11-13 DIAGNOSIS — N920 Excessive and frequent menstruation with regular cycle: Secondary | ICD-10-CM | POA: Insufficient documentation

## 2022-11-13 DIAGNOSIS — N946 Dysmenorrhea, unspecified: Secondary | ICD-10-CM | POA: Insufficient documentation

## 2022-11-13 LAB — CBC WITH DIFF
BASOPHIL #: <0.1 10*3/uL (ref <=0.20)
BASOPHIL %: 1 %
EOSINOPHIL #: 0.12 10*3/uL (ref <=0.50)
EOSINOPHIL %: 1.6 %
HCT: 36.8 % (ref 34.8–46.0)
HGB: 11.9 g/dL (ref 11.5–16.0)
IMMATURE GRANULOCYTE #: <0.1 10*3/uL (ref <0.10)
IMMATURE GRANULOCYTE %: 0.3 % (ref 0.0–1.0)
LYMPHOCYTE #: 2.42 10*3/uL (ref 1.00–4.80)
LYMPHOCYTE %: 32.9 %
MCH: 28.8 pg (ref 26.0–32.0)
MCHC: 32.3 g/dL (ref 31.0–35.5)
MCV: 89.1 fL (ref 78.0–100.0)
MONOCYTE #: 0.49 10*3/uL (ref 0.20–1.10)
MONOCYTE %: 6.7 %
MPV: 10.6 fL (ref 8.7–12.5)
NEUTROPHIL #: 4.23 10*3/uL (ref 1.50–7.70)
NEUTROPHIL %: 57.5 %
PLATELETS: 287 10*3/uL (ref 150–400)
RBC: 4.13 10*6/uL (ref 3.85–5.22)
RDW-CV: 14.1 % (ref 11.5–15.5)
WBC: 7.4 10*3/uL (ref 3.7–11.0)

## 2022-11-13 LAB — INSULIN, SERUM: INSULIN: 9.16 u[IU]/mL (ref 2.60–24.90)

## 2022-11-13 LAB — IRON TRANSFERRIN AND TIBC
IRON (TRANSFERRIN) SATURATION: 14 % — ABNORMAL LOW (ref 20–55)
IRON: 50 ug/dL (ref 37–145)
TOTAL IRON BINDING CAPACITY: 353 ug/dL — ABNORMAL HIGH (ref 112–347)
TRANSFERRIN: 252 mg/dL (ref 200–360)

## 2022-11-13 LAB — THYROID STIMULATING HORMONE WITH FREE T4 REFLEX: TSH: 1.44 u[IU]/mL (ref 0.270–4.200)

## 2022-11-13 LAB — THYROXINE, FREE (FREE T4): THYROXINE (T4), FREE: 1.1 ng/dL (ref 0.93–1.70)

## 2022-11-13 LAB — GLUCOSE FASTING: GLUCOSE FASTING: 89 mg/dL (ref 74–109)

## 2022-11-13 LAB — PROLACTIN: PROLACTIN: 9.9 ng/mL (ref 4.8–23.3)

## 2022-11-17 LAB — TESTOSTERONE FREE (DIALYSIS) AND TOTAL,MS
TESTOSTERONE, FREE: 1.1 pg/mL (ref 0.1–6.4)
TESTOSTERONE,TOTAL,LC/MS/MS: 16 ng/dL (ref 2–45)

## 2022-11-20 ENCOUNTER — Encounter (INDEPENDENT_AMBULATORY_CARE_PROVIDER_SITE_OTHER): Payer: Self-pay | Admitting: Obstetrics & Gynecology

## 2022-12-17 ENCOUNTER — Other Ambulatory Visit: Payer: Self-pay

## 2022-12-17 ENCOUNTER — Encounter (INDEPENDENT_AMBULATORY_CARE_PROVIDER_SITE_OTHER): Payer: Self-pay | Admitting: OBSTETRICS/GYNECOLOGY

## 2022-12-17 ENCOUNTER — Ambulatory Visit: Payer: MEDICAID | Attending: OBSTETRICS/GYNECOLOGY | Admitting: OBSTETRICS/GYNECOLOGY

## 2022-12-17 ENCOUNTER — Other Ambulatory Visit (HOSPITAL_BASED_OUTPATIENT_CLINIC_OR_DEPARTMENT_OTHER): Payer: MEDICAID

## 2022-12-17 VITALS — BP 100/70 | Ht 65.0 in | Wt 181.0 lb

## 2022-12-17 DIAGNOSIS — N920 Excessive and frequent menstruation with regular cycle: Secondary | ICD-10-CM

## 2022-12-17 DIAGNOSIS — N946 Dysmenorrhea, unspecified: Secondary | ICD-10-CM

## 2022-12-17 DIAGNOSIS — N939 Abnormal uterine and vaginal bleeding, unspecified: Secondary | ICD-10-CM | POA: Insufficient documentation

## 2022-12-17 MED ORDER — LETROZOLE 2.5 MG TABLET
2.5000 mg | ORAL_TABLET | Freq: Every day | ORAL | 3 refills | Status: DC
Start: 2022-12-17 — End: 2023-03-04
  Filled 2022-12-17 (×2): qty 5, 5d supply, fill #0

## 2022-12-17 NOTE — Progress Notes (Signed)
[facility]  OB/GYN, [facility]  [address]  [place] [address]  [phone]         GYN PROBLEM VISIT    PATIENT: Laura Stanton  CHART NUMBER: [id number]  DATE OF SERVICE:12/17/2022      CC: Irregular Bleeding (No concerns)        HPI: Dmiyah Liscano is a 32 y.o. year old G3P1011  for  f/u aub.  Hb 11.9, plts 287k, nl iron, elev tibc, low iron sat, transferrin nl. TSH/free t4 nl, prolactin nl. Insulin 9.16, gluc 89.  Testosterone levels nl.   Gc/chlam neg, pap nl, hpv neg.  TVS shows small fibroids as well as string of pearls c/w anovulation.  Pcos labs nl.      H/o aub, clots.  Small fibroids, pcos/labs nl.   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  Condoms.  Has tried iud in the past without tolerating it well. Had it removed early.   H/o anemia - had iron infusions over winter.    TVS AUB:  Uterus 8.31 x 5.6 x 5.32 cm.  Several small fibroids present - largest is 1.8 x 2.16 cm. ET 7.30.  LO 3.46 x 1.82 x 1.85 cm.  Multiple small follicles in string of pearls pattern consistent with anovulation.  RO 2.81 x 2.91 x 2.73 cm.  Multiple small follicles in string of pearls pattern consistent with anovulation.   No large cysts, no free fluid.     D/w pt options - she wishes to plan pregnancy.  D/w pt options - interested in ov induction.  D/w pt r/b including cysts, multiple gestation which is higher risk, etc.  All questions answered.    OB History:  OB History   Gravida Para Term Preterm AB Living   3 2 1   1 1    SAB IAB Ectopic Multiple Live Births           1      # Outcome Date GA Lbr Len/2nd Weight Sex Delivery Anes PTL Lv   3 Term 11/19/12     CS-LTranv      2 Para            1 AB                PAST MEDICAL HISTORY:  Past Medical History:   Diagnosis Date    Deficiency anemia     Depression     Headache     Seizure (CMS HCC)     Vaginal infection        PAST SURGICAL HISTORY:  Past Surgical History:   Procedure Laterality Date    HX BLOOD TRANSFUSION      HX BREAST  REDUCTION  10/09/2014    HX CESAREAN SECTION      HX GASTRIC SLEEVE      VERTICAL SLEEVE       FAMILY HISTORY:  Family Medical History:       Problem Relation (Age of Onset)    Mental illness Maternal Grandmother, Mother    Thyroid Disease Mother            SOCIAL HISTORY:  Social History     Socioeconomic History    Marital status: Single   Tobacco Use    Smoking status: Never    Smokeless tobacco: Never   Vaping Use    Vaping status: Never Used   Substance and Sexual Activity    Alcohol use: No  Drug use: No    Sexual activity: Yes     Partners: Male     Birth control/protection: None        CURRENT MEDICATIONS:   Current Outpatient Medications   Medication Sig    Ibuprofen (MOTRIN) 800 mg Oral Tablet     letrozole (FEMARA) 2.5 mg Oral Tablet Take 1 Tablet (2.5 mg total) by mouth Once a day Take 1 po daily on cycle days 3-7        ALLERGIES:  Patient has no known allergies.     ROS:  As discussed in HPI     PHYSICAL EXAMINATION:   Vitals:    12/17/22 1526   BP: 100/70   Weight: 82.1 kg (181 lb)   Height: 1.651 m (5\' 5" )   BMI: 30.18      Physical Exam  Constitutional:       Appearance: Normal appearance.   Neurological:      Mental Status: She is alert.   Vitals reviewed.          ASSESSMENT/PLAN:    ICD-10-CM    1. Abnormal uterine bleeding (AUB)  N93.9 letrozole (FEMARA) 2.5 mg Oral Tablet     PROGESTERONE        Start pnv, diet/exercise, decrease caffeine.  Rx letrozole 2.5mg  po q day on CD 3-7.  Check CD 21 prog.  Timed intercourse qod CD 10-20.  D/w pt at length.  Rto 4 months or prn.      Follow up appointment       Golden Pop, MD    This note was partially generated using MModal Fluency Direct system, and there may be some incorrect words, spellings, and punctuation that were not noted in checking the note before saving.

## 2023-02-28 ENCOUNTER — Other Ambulatory Visit: Payer: Self-pay

## 2023-02-28 ENCOUNTER — Telehealth (HOSPITAL_BASED_OUTPATIENT_CLINIC_OR_DEPARTMENT_OTHER): Payer: Self-pay | Admitting: Family Medicine

## 2023-02-28 ENCOUNTER — Other Ambulatory Visit (HOSPITAL_BASED_OUTPATIENT_CLINIC_OR_DEPARTMENT_OTHER): Payer: Self-pay | Admitting: Family Medicine

## 2023-02-28 MED ORDER — FLUCONAZOLE 150 MG TABLET
ORAL_TABLET | ORAL | 2 refills | Status: DC
Start: 2023-02-28 — End: 2023-04-03
  Filled 2023-02-28: qty 2, fill #0
  Filled 2023-02-28: qty 2, 3d supply, fill #0

## 2023-02-28 NOTE — Telephone Encounter (Signed)
Pt called and is asking if you can call her in some yeast infection medication. Please advise      Preferred Pharmacy                       [facility] [address] [place] - [address] [facility]    [address] [place] [address]    Phone: [phone] Fax: [phone]    Hours: Not open 24 hours

## 2023-03-03 ENCOUNTER — Telehealth (INDEPENDENT_AMBULATORY_CARE_PROVIDER_SITE_OTHER): Payer: Self-pay | Admitting: OBSTETRICS/GYNECOLOGY

## 2023-03-03 DIAGNOSIS — N939 Abnormal uterine and vaginal bleeding, unspecified: Secondary | ICD-10-CM

## 2023-03-03 DIAGNOSIS — N970 Female infertility associated with anovulation: Secondary | ICD-10-CM

## 2023-03-03 NOTE — Telephone Encounter (Addendum)
Pt states that she needs refill on letrozole and or if you want to see her.  She has not had progesterone drawn- she missed the day that she should have had it done. Please advise.

## 2023-03-04 ENCOUNTER — Other Ambulatory Visit: Payer: Self-pay

## 2023-03-04 MED ORDER — LETROZOLE 2.5 MG TABLET
5.0000 mg | ORAL_TABLET | Freq: Every day | ORAL | 3 refills | Status: DC
Start: 2023-03-04 — End: 2023-05-19
  Filled 2023-03-04: qty 10, 5d supply, fill #0

## 2023-03-04 NOTE — Telephone Encounter (Signed)
Will increase dose to 5 mg on CD 3-7.  Check CD 21 prog level.  D/w pt at length.

## 2023-03-04 NOTE — Telephone Encounter (Signed)
Pt is asking for refill on medication.  She states that she did not have progesterone done. She was not sure if you needed to see her again to give medication or if you could just call it in.

## 2023-03-18 ENCOUNTER — Other Ambulatory Visit: Payer: Self-pay

## 2023-03-18 ENCOUNTER — Ambulatory Visit (HOSPITAL_BASED_OUTPATIENT_CLINIC_OR_DEPARTMENT_OTHER): Payer: MEDICAID | Admitting: Family Medicine

## 2023-03-18 DIAGNOSIS — D649 Anemia, unspecified: Secondary | ICD-10-CM | POA: Insufficient documentation

## 2023-04-03 ENCOUNTER — Telehealth (INDEPENDENT_AMBULATORY_CARE_PROVIDER_SITE_OTHER): Payer: Self-pay | Admitting: OBSTETRICS/GYNECOLOGY

## 2023-04-03 ENCOUNTER — Other Ambulatory Visit: Payer: Self-pay

## 2023-04-03 MED ORDER — FLUCONAZOLE 150 MG TABLET
ORAL_TABLET | ORAL | 2 refills | Status: DC
Start: 2023-04-03 — End: 2023-05-19
  Filled 2023-04-03: qty 2, 28d supply, fill #0

## 2023-04-03 NOTE — Telephone Encounter (Signed)
Rx sent 

## 2023-04-03 NOTE — Telephone Encounter (Signed)
Patient called requesting diflucan for a yeast infection. She stated she is experiencing some itching and white-cottage cheese like discharge. She states she tried the OTC one day monistat but did not notice a difference.

## 2023-04-04 ENCOUNTER — Other Ambulatory Visit: Payer: Self-pay

## 2023-04-07 NOTE — Telephone Encounter (Signed)
Lvm notifying patient, advised her to call with any questions.   Thanks!

## 2023-04-10 ENCOUNTER — Telehealth (INDEPENDENT_AMBULATORY_CARE_PROVIDER_SITE_OTHER): Payer: Self-pay | Admitting: OBSTETRICS/GYNECOLOGY

## 2023-04-10 ENCOUNTER — Ambulatory Visit (INDEPENDENT_AMBULATORY_CARE_PROVIDER_SITE_OTHER): Payer: Self-pay | Admitting: OBSTETRICS/GYNECOLOGY

## 2023-04-10 DIAGNOSIS — Z3490 Encounter for supervision of normal pregnancy, unspecified, unspecified trimester: Secondary | ICD-10-CM

## 2023-04-10 NOTE — Telephone Encounter (Signed)
Need new ob US order. Lmp 03/02/23

## 2023-04-10 NOTE — Telephone Encounter (Signed)
Order sent.

## 2023-05-06 ENCOUNTER — Ambulatory Visit (INDEPENDENT_AMBULATORY_CARE_PROVIDER_SITE_OTHER): Payer: Self-pay | Admitting: OBSTETRICS/GYNECOLOGY

## 2023-05-06 ENCOUNTER — Other Ambulatory Visit (INDEPENDENT_AMBULATORY_CARE_PROVIDER_SITE_OTHER): Payer: Self-pay

## 2023-05-12 ENCOUNTER — Ambulatory Visit (INDEPENDENT_AMBULATORY_CARE_PROVIDER_SITE_OTHER): Payer: Self-pay | Admitting: OBSTETRICS/GYNECOLOGY

## 2023-05-19 ENCOUNTER — Ambulatory Visit (HOSPITAL_BASED_OUTPATIENT_CLINIC_OR_DEPARTMENT_OTHER): Payer: Self-pay

## 2023-05-19 ENCOUNTER — Other Ambulatory Visit: Payer: Self-pay

## 2023-05-19 ENCOUNTER — Ambulatory Visit: Payer: Self-pay | Attending: OBSTETRICS/GYNECOLOGY | Admitting: OBSTETRICS/GYNECOLOGY

## 2023-05-19 ENCOUNTER — Encounter (INDEPENDENT_AMBULATORY_CARE_PROVIDER_SITE_OTHER): Payer: Self-pay | Admitting: OBSTETRICS/GYNECOLOGY

## 2023-05-19 VITALS — BP 98/60 | Ht 65.0 in | Wt 183.0 lb

## 2023-05-19 DIAGNOSIS — Z3689 Encounter for other specified antenatal screening: Secondary | ICD-10-CM | POA: Insufficient documentation

## 2023-05-19 DIAGNOSIS — Z3491 Encounter for supervision of normal pregnancy, unspecified, first trimester: Secondary | ICD-10-CM

## 2023-05-19 DIAGNOSIS — Z3490 Encounter for supervision of normal pregnancy, unspecified, unspecified trimester: Secondary | ICD-10-CM

## 2023-05-19 DIAGNOSIS — O34219 Maternal care for unspecified type scar from previous cesarean delivery: Secondary | ICD-10-CM

## 2023-05-19 DIAGNOSIS — O99841 Bariatric surgery status complicating pregnancy, first trimester: Secondary | ICD-10-CM

## 2023-05-19 DIAGNOSIS — Z3A11 11 weeks gestation of pregnancy: Secondary | ICD-10-CM | POA: Insufficient documentation

## 2023-05-19 DIAGNOSIS — Z3481 Encounter for supervision of other normal pregnancy, first trimester: Secondary | ICD-10-CM | POA: Insufficient documentation

## 2023-05-19 LAB — CBC
HCT: 34.7 % — ABNORMAL LOW (ref 34.8–46.0)
HGB: 11.4 g/dL — ABNORMAL LOW (ref 11.5–16.0)
MCH: 28.4 pg (ref 26.0–32.0)
MCHC: 32.9 g/dL (ref 31.0–35.5)
MCV: 86.3 fL (ref 78.0–100.0)
MPV: 9.8 fL (ref 8.7–12.5)
PLATELETS: 320 10*3/uL (ref 150–400)
RBC: 4.02 10*6/uL (ref 3.85–5.22)
RDW-CV: 14.2 % (ref 11.5–15.5)
WBC: 9.5 10*3/uL (ref 3.7–11.0)

## 2023-05-19 LAB — DRUG SCREEN, WITH CONFIRMATION, URINE
AMPHETAMINES URINE: NEGATIVE
BARBITURATES URINE: NEGATIVE
BENZODIAZEPINES URINE: NEGATIVE
BUPRENORPHINE URINE: NEGATIVE
CANNABINOIDS URINE: NEGATIVE
COCAINE METABOLITES URINE: NEGATIVE
CREAT-ADULTERATION: 237 mg/dL (ref 22.0–250.0)
ETHANOL, URINE: NEGATIVE
FENTANYL, URINE: NEGATIVE
HEROIN URINE: NEGATIVE
METHADONE URINE: NEGATIVE
OPIATES URINE: NEGATIVE
OXYCODONE URINE: NEGATIVE
PH-ADULTERATION: 5.6 (ref 4.5–8.0)

## 2023-05-19 LAB — TYPE AND SCREEN
ABO/RH(D): O POS
ANTIBODY SCREEN: NEGATIVE

## 2023-05-19 LAB — HEPATITIS B SURFACE ANTIGEN: HBV SURFACE ANTIGEN: NONREACTIVE

## 2023-05-19 LAB — URINALYSIS, MACRO/MICRO
BACTERIA: 649.1 /[HPF] — ABNORMAL HIGH (ref <=0)
BILIRUBIN: NEGATIVE mg/dL
BLOOD: NEGATIVE mg/dL
GLUCOSE: NEGATIVE mg/dL
LEUKOCYTES: NEGATIVE WBCs/uL
NITRITE: NEGATIVE
PH: 5.5 (ref 5.0–7.0)
RBCS: 8.4 /[HPF] — ABNORMAL HIGH (ref <=3.0)
SPECIFIC GRAVITY: 1.032 — ABNORMAL HIGH (ref 1.005–1.025)
SQUAMOUS EPITHELIAL CELLS: 7.9 /[HPF] (ref 0.0–10.0)
TOTAL CASTS: 3.6 /[LPF] — ABNORMAL HIGH (ref <=2.0)
UROBILINOGEN: 1 mg/dL
WBCS: 7 /[HPF] — ABNORMAL HIGH (ref <=4.0)

## 2023-05-19 LAB — HEPATITIS C ANTIBODY SCREEN WITH REFLEX TO HCV PCR: HCV ANTIBODY: NONREACTIVE

## 2023-05-19 LAB — HGA1C (HEMOGLOBIN A1C WITH EST AVG GLUCOSE)
ESTIMATED AVERAGE GLUCOSE: 114 mg/dL
HEMOGLOBIN A1C: 5.6 % (ref 4.0–6.0)

## 2023-05-19 LAB — HIV1/HIV2 SCREEN, COMBINED ANTIGEN AND ANTIBODY: HIV-1 AND HIV-2: NONREACTIVE

## 2023-05-19 LAB — RUBELLA VIRUS IGG AB: RUBELLA IGG ANTIBODY: 353 [IU]/mL (ref >=10.0)

## 2023-05-19 MED ORDER — ONDANSETRON HCL 8 MG TABLET
8.0000 mg | ORAL_TABLET | Freq: Three times a day (TID) | ORAL | 3 refills | Status: AC | PRN
Start: 2023-05-19 — End: ?
  Filled 2023-05-19: qty 90, 30d supply, fill #0

## 2023-05-19 NOTE — Progress Notes (Signed)
OB/GYN, [facility]  [address]  [place] [address]  Operated by [facility]     Name: Laura Stanton MRN:  [id number]   Date: 05/19/2023 Age: 32 y.o.       Laura Stanton is a 32 y.o. [phone] here for new ob @ [redacted]w[redacted]d  by LMP c/w u/s.  C/o n/v, d/w pt pnv.      H/o anovulation - conceived on letrozole 5mg .  PCOS labs nl.   H/o CS x 2.  (PCS complicated by PPH requiring transfusion).  Pt interested in TOL  H/o Gastric Sleeve  BMI 30.      HPI/ROS:    OB History   Gravida Para Term Preterm AB Living   4 2 1  0 1 1   SAB IAB Ectopic Multiple Live Births   0 0 0 0 1      # Outcome Date GA Lbr Len/2nd Weight Sex Type Anes PTL Lv   4 Current            3 Term 11/19/12     CS-LTranv      2 Para            1 AB               Obstetric Comments   G1 40+ wks iol.  PCS - VFI 7 lbs 11 oz.  Arrest of dilation. PPH - transfused 2-4 units.  Postop pneumonia, hb 4.9.  readmitted with another transfusion.     G2 SAB 1st trim.  No d&c needed   G3 38 wks RCS VMI 8 lbs 10 oz.  No complications.  Postop GHTN.  G4 current.      No Known Allergies    Current Outpatient Medications:     ondansetron (ZOFRAN) 8 mg Oral Tablet, Take 1 Tablet (8 mg total) by mouth Every 8 hours as needed for Nausea/Vomiting, Disp: 90 Tablet, Rfl: 3  Past Medical History:   Diagnosis Date    Deficiency anemia     Depression     Headache     Seizure (CMS HCC)     Vaginal infection      Past Surgical History:   Procedure Laterality Date    HX BLOOD TRANSFUSION      HX BREAST REDUCTION  10/09/2014    HX CESAREAN SECTION      HX GASTRIC SLEEVE      VERTICAL SLEEVE     Family Medical History:       Problem Relation (Age of Onset)    Mental illness Maternal Grandmother, Mother    Thyroid Disease Mother            Social Hx:  reports that she has never smoked. She has never used smokeless tobacco. She reports that she does not drink alcohol and does not use drugs.  Patient Active Problem List   Diagnosis    Anemia        Review of  Systems  Review of Systems   Gastrointestinal:  Positive for constipation, nausea and vomiting.        Physical Exam:   Weight: 83 kg (183 lb)  Height: 165.1 cm (5\' 5" )  BP (Non-Invasive): 98/60  Body mass index is 30.45 kg/m.  OBGyn Exam    OB Prenatal Exam: Last filed by Golden Pop, MD on 05/19/2023  5:39 PM       General Physical Exam       HEENT: normal  Heart: normal  Skin: normal     Thyroid: normal  Extremities: normal  Lymph Nodes: normal     Breasts: normal  Neurological: normal  Abdomen: normal                  Pelvic Exam       Vulva: normal  Vagina: normal     Cervix: normal  Uterus: 11 weeks     Adnexa: normal  Spines: average     Sacrum: concave  Subpubic Arch: normal     Pelvic Type: gynecoid                               TVS NOB: Siup cwd. +fhts 174. + ys. Normal ovaries bilaterally.     Assessment:   Pregnancy confirmation visit with normal gyn exam    ICD-10-CM    1. Encounter for other specified antenatal screening  Z36.89 CBC     TYPE AND SCREEN     HEPATITIS B SURFACE ANTIGEN     HEPATITIS C ANTIBODY SCREEN WITH REFLEX TO HCV PCR     HIV1/HIV2 SCREEN, COMBINED ANTIGEN AND ANTIBODY     URINE CULTURE     DRUG SCREEN, WITH CONFIRMATION, URINE     HGA1C (HEMOGLOBIN A1C WITH EST AVG GLUCOSE)     RUBELLA VIRUS IGG AB     URINALYSIS WITH REFLEX MICROSCOPIC AND CULTURE IF POSITIVE     SYPHILIS, RAPID PLASMIN REAGIN (RPR), WITH TITER REFLEX     CHLAMYDIA TRACHOMATIS/NEISSERIA GONORRHOEAE RNA, NAAT     VAGINITIS PATHOGENS: BACTERIAL, CANDIDA, AND TV PANEL, TMA     Horizon 14 (Pan-Ethnic Standard)     PANORAMA PRENATAL TEST      2. Encounter for supervision of other normal pregnancy in first trimester  Z34.81 CBC     TYPE AND SCREEN     HEPATITIS B SURFACE ANTIGEN     HEPATITIS C ANTIBODY SCREEN WITH REFLEX TO HCV PCR     HIV1/HIV2 SCREEN, COMBINED ANTIGEN AND ANTIBODY     URINE CULTURE     DRUG SCREEN, WITH CONFIRMATION, URINE     HGA1C (HEMOGLOBIN A1C WITH EST AVG GLUCOSE)     RUBELLA VIRUS IGG AB      URINALYSIS WITH REFLEX MICROSCOPIC AND CULTURE IF POSITIVE     SYPHILIS, RAPID PLASMIN REAGIN (RPR), WITH TITER REFLEX     CHLAMYDIA TRACHOMATIS/NEISSERIA GONORRHOEAE RNA, NAAT     VAGINITIS PATHOGENS: BACTERIAL, CANDIDA, AND TV PANEL, TMA      3. [redacted] weeks gestation of pregnancy  Z3A.11 CBC     TYPE AND SCREEN     HEPATITIS B SURFACE ANTIGEN     HEPATITIS C ANTIBODY SCREEN WITH REFLEX TO HCV PCR     HIV1/HIV2 SCREEN, COMBINED ANTIGEN AND ANTIBODY     URINE CULTURE     DRUG SCREEN, WITH CONFIRMATION, URINE     HGA1C (HEMOGLOBIN A1C WITH EST AVG GLUCOSE)     RUBELLA VIRUS IGG AB     URINALYSIS WITH REFLEX MICROSCOPIC AND CULTURE IF POSITIVE     SYPHILIS, RAPID PLASMIN REAGIN (RPR), WITH TITER REFLEX     CHLAMYDIA TRACHOMATIS/NEISSERIA GONORRHOEAE RNA, NAAT     VAGINITIS PATHOGENS: BACTERIAL, CANDIDA, AND TV PANEL, TMA     Horizon 14 (Pan-Ethnic Standard)     PANORAMA PRENATAL TEST           Plan:   Check pnl, nuswab, gc/chlam,  A1c, horizon 14 and NIPT.  Prenatal vitamins.  D/w pt poss of aspirin 12-36 wks, but hesitant due to gastric sleeve.  Will consider.    Genetic screening discussed.  Counseling done - diet, exercise, restrictions, etc.  Discussed early symptoms of pregnancy.  Follow-up Return in about 4 weeks (around 06/16/2023).  Msafp and 1hr glucola at that time.      Golden Pop, MD      This note was partially generated using MModal Fluency Direct system, and there may be some incorrect words, spellings, and punctuation that were not noted in checking the note before saving.

## 2023-05-20 LAB — SYPHILIS, RAPID PLASMIN REAGIN (RPR), WITH TITER REFLEX: RPR QUALITATIVE: NONREACTIVE

## 2023-05-20 LAB — URINE CULTURE: URINE CULTURE: NO GROWTH

## 2023-05-20 NOTE — Result Encounter Note (Signed)
Pt advised.

## 2023-05-21 LAB — CHLAMYDIA TRACHOMATIS/NEISSERIA GONORRHOEAE RNA, NAAT
CHLAMYDIA TRACHOMATIS RNA: NEGATIVE
NEISSERIA GONORRHEA GC RNA: NEGATIVE

## 2023-05-21 LAB — VAGINITIS PATHOGENS: BACTERIAL, CANDIDA, AND TV PANEL, TMA
BACTERIAL VAGINOSIS: NEGATIVE
CANDIDA GLABRATA: NEGATIVE
CANDIDA SPECIES: NEGATIVE
TRICHOMONAS VAGINALIS: NEGATIVE

## 2023-05-26 LAB — PANORAMA PRENATAL TEST: FETAL FRACTION: 3.3

## 2023-06-02 ENCOUNTER — Other Ambulatory Visit: Payer: Self-pay

## 2023-06-02 MED ORDER — LEVONORGESTREL-ETHINYL ESTRADIOL 0.1 MG-20 MCG TABLET
1.0000 | ORAL_TABLET | Freq: Every day | ORAL | 4 refills | Status: AC
Start: 2023-05-31 — End: ?
  Filled 2023-06-02: qty 84, 84d supply, fill #0

## 2023-06-03 LAB — HORIZON 14 (PAN-ETHNIC STANDARD)
ALPHA-THALASSEMIA: NEGATIVE
BETA-HEMOGLOBINOPATHIES: NEGATIVE
CANAVAN DISEASE: NEGATIVE
CYSTIC FIBROSIS: NEGATIVE
DUCHENNE/BECKER MUSCULAR DYSTROPHY: NEGATIVE
FAMILIAL DYSAUTONOMIA: NEGATIVE
FRAGILE X SYNDROME: NEGATIVE
GALACTOSEMIA: NEGATIVE
GAUCHER DISEASE: NEGATIVE
MEDIUM CHAIN ACYL-COA DEHYDROGENASE DEFICIENCY: NEGATIVE
POLYCYSTIC KIDNEY DISEASE AUTOSOMAL RECESSIVE: NEGATIVE
REPORT SUMMARY: NEGATIVE
SMITH-LEMLI-OPITZ SYNDROME: NEGATIVE
SPINAL MUSCULAR ATROPHY: NEGATIVE
TAY-SACHS DISEASE: NEGATIVE

## 2023-06-16 ENCOUNTER — Ambulatory Visit (INDEPENDENT_AMBULATORY_CARE_PROVIDER_SITE_OTHER): Payer: Self-pay | Admitting: OBSTETRICS/GYNECOLOGY

## 2023-06-23 ENCOUNTER — Other Ambulatory Visit: Payer: Self-pay

## 2023-06-24 ENCOUNTER — Ambulatory Visit (INDEPENDENT_AMBULATORY_CARE_PROVIDER_SITE_OTHER): Payer: Self-pay | Admitting: OBSTETRICS/GYNECOLOGY

## 2023-07-21 ENCOUNTER — Ambulatory Visit (HOSPITAL_BASED_OUTPATIENT_CLINIC_OR_DEPARTMENT_OTHER): Payer: Medicare (Managed Care) | Admitting: Family Medicine

## 2023-11-14 ENCOUNTER — Emergency Department
Admission: EM | Admit: 2023-11-14 | Discharge: 2023-11-15 | Disposition: A | Discharge status: Home / Self Care | Payer: Medicare (Managed Care) | Attending: WVUPC-GENERAL SURGERY | Admitting: WVUPC-GENERAL SURGERY

## 2023-11-14 ENCOUNTER — Encounter (HOSPITAL_COMMUNITY): Payer: Self-pay

## 2023-11-14 ENCOUNTER — Other Ambulatory Visit: Payer: Self-pay

## 2023-11-14 DIAGNOSIS — R110 Nausea: Secondary | ICD-10-CM | POA: Insufficient documentation

## 2023-11-14 DIAGNOSIS — F1910 Other psychoactive substance abuse, uncomplicated: Secondary | ICD-10-CM

## 2023-11-14 DIAGNOSIS — F1990 Other psychoactive substance use, unspecified, uncomplicated: Secondary | ICD-10-CM | POA: Insufficient documentation

## 2023-11-14 DIAGNOSIS — R002 Palpitations: Secondary | ICD-10-CM | POA: Insufficient documentation

## 2023-11-14 DIAGNOSIS — Z3202 Encounter for pregnancy test, result negative: Secondary | ICD-10-CM

## 2023-11-14 LAB — CBC WITH DIFF
BASOPHIL #: <0.1 10*3/uL (ref <=0.20)
BASOPHIL %: 0.6 %
EOSINOPHIL #: 0.1 10*3/uL (ref <=0.50)
EOSINOPHIL %: 0.9 %
HCT: 31.5 % — ABNORMAL LOW (ref 34.8–46.0)
HGB: 10.1 g/dL — ABNORMAL LOW (ref 11.5–16.0)
IMMATURE GRANULOCYTE #: <0.1 10*3/uL (ref <0.10)
IMMATURE GRANULOCYTE %: 0.5 % (ref 0.0–1.0)
LYMPHOCYTE #: 3.02 10*3/uL (ref 1.00–4.80)
LYMPHOCYTE %: 25.7 %
MCH: 26.7 pg (ref 26.0–32.0)
MCHC: 32.1 g/dL (ref 31.0–35.5)
MCV: 83.3 fL (ref 78.0–100.0)
MONOCYTE #: 0.81 10*3/uL (ref 0.20–1.10)
MONOCYTE %: 6.9 %
MPV: 10.4 fL (ref 8.7–12.5)
NEUTROPHIL #: 7.68 10*3/uL (ref 1.50–7.70)
NEUTROPHIL %: 65.4 %
PLATELETS: 346 10*3/uL (ref 150–400)
RBC: 3.78 10*6/uL — ABNORMAL LOW (ref 3.85–5.22)
RDW-CV: 13.2 % (ref 11.5–15.5)
WBC: 11.7 10*3/uL — ABNORMAL HIGH (ref 3.7–11.0)

## 2023-11-14 LAB — COMPREHENSIVE METABOLIC PANEL, NON-FASTING
ALBUMIN: 3.9 g/dL (ref 3.5–5.2)
ALKALINE PHOSPHATASE: 79 U/L (ref 35–129)
ALT (SGPT): 10 U/L (ref 0–33)
ANION GAP: 13 mmol/L (ref 7–18)
AST (SGOT): 18 U/L (ref 0–32)
BILIRUBIN TOTAL: 0.2 mg/dL (ref 0.2–1.2)
BUN: 13 mg/dL (ref 6–20)
CALCIUM: 8.9 mg/dL (ref 8.3–10.7)
CHLORIDE: 103 mmol/L (ref 96–106)
CO2 TOTAL: 23 mmol/L (ref 22–30)
CREATININE: 0.81 mg/dL (ref 0.50–0.90)
ESTIMATED GFR: >90 mL/min/{1.73_m2} (ref >90)
GLUCOSE: 131 mg/dL — ABNORMAL HIGH (ref 74–109)
POTASSIUM: 3.6 mmol/L (ref 3.2–5.0)
PROTEIN TOTAL: 7.2 g/dL (ref 6.4–8.3)
SODIUM: 139 mmol/L (ref 133–144)

## 2023-11-14 LAB — HCG, SERUM QUALITATIVE, PREGNANCY: PREGNANCY, SERUM QUALITATIVE: NEGATIVE

## 2023-11-14 LAB — THYROID STIMULATING HORMONE WITH FREE T4 REFLEX: TSH: 0.54 u[IU]/mL (ref 0.270–4.200)

## 2023-11-14 LAB — TROPONIN-T: TROPONIN-T: <6 ng/L (ref <=14)

## 2023-11-14 LAB — MAGNESIUM: MAGNESIUM: 1.9 mg/dL (ref 1.6–2.6)

## 2023-11-14 NOTE — ED Nurses Note (Signed)
 Attempted to collect a urine sample, pt went to the bathroom but states she was unable to pee at this time.

## 2023-11-15 LAB — DRUG SCREEN, NO CONFIRMATION, URINE
AMPHETAMINES URINE: NEGATIVE
BARBITURATES URINE: NEGATIVE
BENZODIAZEPINES URINE: NEGATIVE
BUPRENORPHINE URINE: NEGATIVE
CANNABINOIDS URINE: POSITIVE — AB
COCAINE METABOLITES URINE: NEGATIVE
CREAT-ADULTERATION: 254 mg/dL — ABNORMAL HIGH (ref 22.0–250.0)
ETHANOL, URINE: NEGATIVE
FENTANYL, URINE: NEGATIVE
HEROIN URINE: NEGATIVE
METHADONE URINE: NEGATIVE
OPIATES URINE: NEGATIVE
OXYCODONE URINE: NEGATIVE
PH-ADULTERATION: 5.7 (ref 4.5–8.0)

## 2023-11-15 LAB — URINALYSIS, MACRO/MICRO
BACTERIA: 1536.5 /HPF — ABNORMAL HIGH (ref <=0)
BILIRUBIN: NEGATIVE mg/dL
BLOOD: NEGATIVE mg/dL
GLUCOSE: NEGATIVE mg/dL
LEUKOCYTES: NEGATIVE WBCs/uL
NITRITE: NEGATIVE
PH: 6 (ref 5.0–7.0)
RBCS: 4.7 /HPF — ABNORMAL HIGH (ref <=3.0)
SPECIFIC GRAVITY: 1.032 — ABNORMAL HIGH (ref 1.005–1.025)
SQUAMOUS EPITHELIAL CELLS: 12.9 /HPF — ABNORMAL HIGH (ref 0.0–10.0)
TOTAL CASTS: <1.4 /LPF (ref <=2.0)
UROBILINOGEN: 1 mg/dL
WBCS: <1.8 /HPF (ref <=4.0)

## 2023-11-15 LAB — TROPONIN-T: TROPONIN-T: <6 ng/L (ref <=14)

## 2023-11-15 NOTE — Discharge Instructions (Signed)
 If you do not have a family doctor you may call the [facility] Referral line at [phone].  Unless otherwise specified you should typically follow-up with your PCP or specialist within the next 2 days.    Some abnormal test results may not relate specifically to problems addressed today but may benefit from further testing and followup with your doctor.    It is very important that you follow up with your doctor to discuss all of your results from today's visit.  Discuss any new medications prescribed to be sure these do not interact with any you may already be taking.  Do not drive or operate heavy machinery while taking any pain medication or muscle relaxers.    Please return immediately if your condition worsens, further concerns arise, or if you have trouble getting a followup appointment.

## 2023-11-15 NOTE — ED Provider Notes (Signed)
 [facility] - Emergency Department  ED Primary Note  History of Present Illness   Laura Stanton is a 33 y.o. female who had concerns including Palpitations.  Arrival via private vehicle.  Patient reporting that she was at "and event tonight and I took something" and since then I have not felt well.  Patient reporting that she has had palpitations had some nausea just does not feel well she reports that it was supposed to have been some form of THC but she is uncertain she has apparently never done that before.  Patient reports she does somewhat feel better now.    Physical Exam   ED Triage Vitals [11/14/23 2214]   BP (Non-Invasive) 109/77   Heart Rate 95   Respiratory Rate 20   Temperature 36.7 C (98.1 F)   SpO2 98 %   Weight 81.6 kg (180 lb)   Height 1.651 m (5' 5)     CONSTITUTIONAL: [Alert and oriented and responds appropriately to questions. Well-appearing; well-nourished]  HEAD: [Normocephalic; atraumatic]  EYES: PERRL; [Conjunctivae clear, sclerae non-icteric]  ENT: [normal nose; no rhinorrhea; moist mucous membranes; pharynx without lesions noted]  CARD: [RRR; no murmurs, no clicks, no rubs, no gallops; symmetric distal pulses]  RESP: [Normal chest excursion without splinting or tachypnea; breath sounds clear and equal bilaterally; no wheezes, no rhonchi, no rales,]  ABD/GI:[non-distended; soft, non-tender, no rebound, no guarding;]  EXT: [Appear atraumatic; non-tender to palpation; no cyanosis, no effusions, no edema]   SKIN: [Normal color for age and race; warm; dry; good turgor; no acute lesions noted]  NEURO: [Moves all extremities equally; Sensory function intact]  PSYCH: [The patient's mood and manner are appropriate. Grooming and personal hygiene are appropriate.]     Patient Data   Labs Ordered/Reviewed   COMPREHENSIVE METABOLIC PANEL, NON-FASTING - Abnormal; Notable for the following components:       Result Value    GLUCOSE 131 (*)     All other components within normal limits   CBC  WITH DIFF - Abnormal; Notable for the following components:    WBC 11.7 (*)     RBC 3.78 (*)     HGB 10.1 (*)     HCT 31.5 (*)     All other components within normal limits   TROPONIN-T - Normal    Narrative:     In order to distinguish acute elevations of high sensitive Troponin from other clinical conditions, the Universal Definition of myocardial infarction stresses clinical assessment and the need for serial measurements to observe a rise and/or fall above the upper limit of the reference interval.    HCG, SERUM QUALITATIVE, PREGNANCY - Normal   TROPONIN-T - Normal    Narrative:     In order to distinguish acute elevations of high sensitive Troponin from other clinical conditions, the Universal Definition of myocardial infarction stresses clinical assessment and the need for serial measurements to observe a rise and/or fall above the upper limit of the reference interval.    MAGNESIUM - Normal   THYROID  STIMULATING HORMONE WITH FREE T4 REFLEX - Normal   CBC/DIFF    Narrative:     The following orders were created for panel order CBC/DIFF.  Procedure                               Abnormality         Status                     ---------                               -----------         ------  CBC WITH OVFI[[id number]]                Abnormal            Final result                 Please view results for these tests on the individual orders.     No orders to display     Medical Decision Making        Medical Decision Making  33 year old female patient presenting to emergency department for complaint of palpitations.  Patient reporting she was at some type of event tonight and took something that she thought was supposed to be THC.  Patient reports that after taking it she has not felt well she has had palpitations dizziness and nausea.  Patient is reporting she is not had THC before she is uncertain if that is what it was however.  Patient reporting she feels better on my  examination.    Patient's physical exam is unremarkable.    Please see ED course below for details the patient's laboratory and image findings.    As can be seen below in patient's ED course patient's laboratory workup is relatively completely unremarkable.  Mild leukocytosis 11.7 mild anemia 10.1 otherwise is unremarkable.  Patient's cardiac enzymes are less than detectable.  EKG showed no acute findings.    Patient observed in the emergency department for greater than 3 hours.  Patient reporting no acute symptoms reporting that she feels much better.  Patient will be discharged home advised to follow-up with her PCP advised to abstain from using unknown substances in the future.  Patient is not willing to wait for her urinalysis or urine drug screen.  Patient will be contacted if needs treatment for urinary tract infection.    Amount and/or Complexity of Data Reviewed  Labs: ordered. Decision-making details documented in ED Course.  ECG/medicine tests: ordered and independent interpretation performed. Decision-making details documented in ED Course.      ED Course as of 11/15/23 0633   Fri Nov 14, 2023   2311 PREGNANCY, SERUM QUALITATIVE: Negative   2311 CBC/DIFF(!)  WBC 11.7, RBC 3.78, hemoglobin 10.1   2311 ECG 12 LEAD  EKG interpretation by myself-sinus rhythm P-waves present, tachycardic rate 99, PR 150, QRS 76, QTC 449, axis normal, no acute ischemic.   Sat Nov 15, 2023   0000 TROPONIN-T: <6   0000 PREGNANCY, SERUM QUALITATIVE: Negative   0000 TSH: 0.540   0000 MAGNESIUM: 1.9   0000 COMPREHENSIVE METABOLIC PANEL, NON-FASTING(!)  Glucose 131   0120 TROPONIN-T: <6               Clinical Impression   Palpitations (Primary)   Substance use   Nausea       Disposition: Discharged

## 2023-11-15 NOTE — ED Provider Notes (Incomplete)
 [facility] - Emergency Department  ED Primary Note  History of Present Illness   Laura Stanton is a 33 y.o. female who had concerns including Palpitations. ***    Physical Exam   ED Triage Vitals [11/14/23 2214]   BP (Non-Invasive) 109/77   Heart Rate 95   Respiratory Rate 20   Temperature 36.7 C (98.1 F)   SpO2 98 %   Weight 81.6 kg (180 lb)   Height 1.651 m (5' 5)     {ED Physical Exam:54666}  Patient Data   Labs Ordered/Reviewed   COMPREHENSIVE METABOLIC PANEL, NON-FASTING - Abnormal; Notable for the following components:       Result Value    GLUCOSE 131 (*)     All other components within normal limits   CBC WITH DIFF - Abnormal; Notable for the following components:    WBC 11.7 (*)     RBC 3.78 (*)     HGB 10.1 (*)     HCT 31.5 (*)     All other components within normal limits   TROPONIN-T - Normal    Narrative:     In order to distinguish acute elevations of high sensitive Troponin from other clinical conditions, the Universal Definition of myocardial infarction stresses clinical assessment and the need for serial measurements to observe a rise and/or fall above the upper limit of the reference interval.    HCG, SERUM QUALITATIVE, PREGNANCY - Normal   MAGNESIUM - Normal   THYROID  STIMULATING HORMONE WITH FREE T4 REFLEX - Normal   CBC/DIFF    Narrative:     The following orders were created for panel order CBC/DIFF.  Procedure                               Abnormality         Status                     ---------                               -----------         ------                     CBC WITH ZOXW[[id number]]                Abnormal            Final result                 Please view results for these tests on the individual orders.   DRUG SCREEN, NO CONFIRMATION, URINE   URINALYSIS WITH REFLEX MICROSCOPIC AND CULTURE IF POSITIVE    Narrative:     The following orders were created for panel order URINALYSIS WITH REFLEX MICROSCOPIC AND CULTURE IF POSITIVE.  Procedure                                Abnormality         Status                     ---------                               -----------         ------  URINALYSIS, MACRO/MICRO[[id number]]                                                       Please view results for these tests on the individual orders.   URINALYSIS, MACRO/MICRO   TROPONIN-T     No orders to display     Medical Decision Making   {?? Select the MDM button at the top of the note composer window and be sure to fill out the MDM SmartBlock in Notewriter to the left:38154}ED clinical impression missing, please click on the following link to add Clinical Impressions ***  then refresh the note prior to signing.    Medical Decision Making  Amount and/or Complexity of Data Reviewed  Labs: ordered.  ECG/medicine tests: ordered and independent interpretation performed. Decision-making details documented in ED Course.      ED Course as of 11/15/23 0001   Fri Nov 14, 2023   2311 PREGNANCY, SERUM QUALITATIVE: Negative   2311 CBC/DIFF(!)  WBC 11.7, RBC 3.78, hemoglobin 10.1   2311 ECG 12 LEAD  EKG interpretation by myself-sinus rhythm P-waves present, tachycardic rate 99, PR 150, QRS 76, QTC 449, axis normal, no acute ischemic.   Sat Nov 15, 2023   0000 TROPONIN-T: <6   0000 PREGNANCY, SERUM QUALITATIVE: Negative   0000 TSH: 0.540   0000 MAGNESIUM: 1.9   0000 COMPREHENSIVE METABOLIC PANEL, NON-FASTING(!)  Glucose 131               ED clinical impression missing, please click on the following link to add Clinical Impressions ***  then refresh the note prior to signing.    Disposition: Data Unavailable  {?Quick Link  Level of Service Calculator:123}  {Critical Care Time (Optional):37527}     {?? Remember to refresh your note prior to signing. Use Control + F11 or click the refresh button at the bottom of the note:38154}

## 2023-11-15 NOTE — ED Nurses Note (Signed)

## 2023-11-16 DIAGNOSIS — F1990 Other psychoactive substance use, unspecified, uncomplicated: Secondary | ICD-10-CM

## 2023-11-16 LAB — ECG 12 LEAD
Atrial Rate: 99 {beats}/min
Calculated P Axis: -2 degrees
Calculated R Axis: 21 degrees
Calculated T Axis: 16 degrees
PR Interval: 150 ms
QRS Duration: 76 ms
QT Interval: 350 ms
QTC Calculation: 449 ms
Ventricular rate: 99 {beats}/min

## 2023-11-16 LAB — URINE CULTURE,ROUTINE: URINE CULTURE: 10000

## 2023-12-16 ENCOUNTER — Other Ambulatory Visit: Payer: Self-pay

## 2023-12-16 MED ORDER — FLUCONAZOLE 100 MG TABLET
ORAL_TABLET | ORAL | 0 refills | Status: DC
Start: 2023-12-16 — End: 2024-01-12
  Filled 2023-12-16: qty 2, 1d supply, fill #0

## 2024-01-12 ENCOUNTER — Other Ambulatory Visit: Payer: Self-pay

## 2024-01-12 MED ORDER — FLUCONAZOLE 100 MG TABLET
ORAL_TABLET | ORAL | 0 refills | Status: AC
Start: 2024-01-12 — End: ?
  Filled 2024-01-12: qty 2, 1d supply, fill #0

## 2024-01-15 ENCOUNTER — Other Ambulatory Visit: Payer: Self-pay

## 2024-01-15 MED ORDER — FLUCONAZOLE 200 MG TABLET
ORAL_TABLET | ORAL | 0 refills | Status: AC
Start: 2024-01-15 — End: ?
  Filled 2024-01-15: qty 2, 1d supply, fill #0

## 2024-01-30 ENCOUNTER — Other Ambulatory Visit: Payer: Self-pay

## 2024-02-03 ENCOUNTER — Other Ambulatory Visit: Payer: Self-pay

## 2024-02-03 MED ORDER — FLUCONAZOLE 200 MG TABLET
ORAL_TABLET | ORAL | 2 refills | Status: AC
Start: 2024-02-03 — End: ?
  Filled 2024-02-03: qty 1, 1d supply, fill #0
  Filled 2024-03-29: qty 1, 1d supply, fill #1

## 2024-02-04 ENCOUNTER — Other Ambulatory Visit: Payer: Self-pay

## 2024-02-09 ENCOUNTER — Other Ambulatory Visit: Payer: Self-pay

## 2024-02-14 ENCOUNTER — Other Ambulatory Visit: Payer: Self-pay

## 2024-03-29 ENCOUNTER — Other Ambulatory Visit: Payer: Self-pay

## 2024-03-30 ENCOUNTER — Other Ambulatory Visit: Payer: Self-pay
